# Patient Record
Sex: Female | Born: 1965 | Race: White | Hispanic: No | Marital: Married | Smoking: Never smoker
Health system: Southern US, Community
[De-identification: ages and names within clinical notes are randomized; demographics above are authoritative.]

## PROBLEM LIST (undated history)

## (undated) DIAGNOSIS — J45909 Unspecified asthma, uncomplicated: Secondary | ICD-10-CM

## (undated) DIAGNOSIS — E039 Hypothyroidism, unspecified: Secondary | ICD-10-CM

## (undated) DIAGNOSIS — Z9889 Other specified postprocedural states: Secondary | ICD-10-CM

## (undated) DIAGNOSIS — J189 Pneumonia, unspecified organism: Secondary | ICD-10-CM

## (undated) DIAGNOSIS — F419 Anxiety disorder, unspecified: Secondary | ICD-10-CM

## (undated) HISTORY — PX: ABDOMINAL HYSTERECTOMY: SHX81

## (undated) HISTORY — PX: APPENDECTOMY: SHX54

## (undated) HISTORY — PX: FOOT SURGERY: SHX648

## (undated) HISTORY — PX: MASS EXCISION: SHX2000

## (undated) HISTORY — PX: BARIATRIC SURGERY: SHX1103

## (undated) HISTORY — PX: CHOLECYSTECTOMY: SHX55

## (undated) HISTORY — DX: Anxiety disorder, unspecified: F41.9

---

## 2012-09-13 ENCOUNTER — Other Ambulatory Visit: Payer: Self-pay | Admitting: Neurosurgery

## 2012-09-13 ENCOUNTER — Ambulatory Visit
Admission: RE | Admit: 2012-09-13 | Discharge: 2012-09-13 | Disposition: A | Discharge status: Home / Self Care | Payer: BC Managed Care – PPO | Source: Ambulatory Visit | Attending: Neurosurgery | Admitting: Neurosurgery

## 2012-09-13 DIAGNOSIS — M5030 Other cervical disc degeneration, unspecified cervical region: Secondary | ICD-10-CM

## 2012-09-13 DIAGNOSIS — M4804 Spinal stenosis, thoracic region: Secondary | ICD-10-CM

## 2012-09-13 DIAGNOSIS — M545 Low back pain, unspecified: Secondary | ICD-10-CM

## 2013-09-05 ENCOUNTER — Ambulatory Visit (INDEPENDENT_AMBULATORY_CARE_PROVIDER_SITE_OTHER): Payer: Self-pay | Admitting: Surgery

## 2013-09-12 ENCOUNTER — Ambulatory Visit (INDEPENDENT_AMBULATORY_CARE_PROVIDER_SITE_OTHER): Payer: BC Managed Care – PPO | Admitting: Surgery

## 2013-09-12 ENCOUNTER — Encounter (INDEPENDENT_AMBULATORY_CARE_PROVIDER_SITE_OTHER): Payer: Self-pay | Admitting: Surgery

## 2013-09-12 VITALS — BP 130/86 | HR 76 | Temp 98.6°F | Resp 15 | Ht 63.0 in | Wt 288.0 lb

## 2013-09-12 DIAGNOSIS — Z9884 Bariatric surgery status: Secondary | ICD-10-CM

## 2013-09-12 DIAGNOSIS — Z4651 Encounter for fitting and adjustment of gastric lap band: Secondary | ICD-10-CM

## 2013-09-12 NOTE — Progress Notes (Signed)
Lapband Fill Encounter Ms. [place] Vanguard band placed by [employer] back in 2007. She did very well and then went to divorced and apparently got off tract. In the meantime she moved up to [place] and was followed by Mardi Mainland at [facility]. Subsequently she lost for followup there and asked to be seen here.  Airleak currently she is able to be without restriction. If she overeats she does throw up. I think she is not in the green zone. I decided it was time to give her fill. Problem List:   Patient Active Problem List   Diagnosis Date Noted  . Lapband Vanguard 11cm June 2007 [place], [place], [place]) 09/12/2013    Jaeda F [place] Body mass index is 49.19 kg/(m^2). Weight loss since surgery  Initial weight was 288 pounds with a BMI of 47.9  Having regurgitation?:  no  Feel that they need a fill? yes  Nocturnal reflux?  no  Amount of fill  0.7 cc     Instructions given and weight loss goals discussed.    I discussed a low carb diet with her. We discussed some of the things like ice cream which she eats which to work her weight loss. I went ahead and filled her with 0.7 cc and I plan to see her back in 6-8 weeks.  Matt B. Daphine Deutscher, MD, FACS

## 2013-09-12 NOTE — Patient Instructions (Signed)

## 2013-11-27 ENCOUNTER — Encounter (INDEPENDENT_AMBULATORY_CARE_PROVIDER_SITE_OTHER): Payer: BC Managed Care – PPO

## 2014-01-01 ENCOUNTER — Ambulatory Visit (INDEPENDENT_AMBULATORY_CARE_PROVIDER_SITE_OTHER): Payer: BC Managed Care – PPO | Admitting: Physician Assistant

## 2014-01-01 ENCOUNTER — Encounter (INDEPENDENT_AMBULATORY_CARE_PROVIDER_SITE_OTHER): Payer: Self-pay

## 2014-01-01 VITALS — BP 124/80 | HR 80 | Temp 98.3°F | Resp 14 | Ht 63.0 in | Wt 266.4 lb

## 2014-01-01 DIAGNOSIS — Z9884 Bariatric surgery status: Secondary | ICD-10-CM

## 2014-01-01 NOTE — Progress Notes (Signed)
  HISTORY: Audrey RosserCarlie F St Bradley is a 48 y.o.female who received an Vanguard lap-band in June 2007 [place]. She was last seen by Dr. Daphine DeutscherMartin in November 2014 for a fill and for a month did fine. She then had significant social stressors prompting her to eat comfort foods and those high in sugars and carbs. She has no issues with lack of restriction. She has no persistent regurgitation or reflux. She has begun working out at a [employer].  VITAL SIGNS: Filed Vitals:   01/01/14 1622  BP: 124/80  Pulse: 80  Temp: 98.3 F (36.8 C)  Resp: 14    PHYSICAL EXAM: Physical exam reveals a very well-appearing 48 y.o.female in no apparent distress Neurologic: Awake, alert, oriented Psych: Bright affect, conversant Respiratory: Breathing even and unlabored. No stridor or wheezing Extremities: Atraumatic, good range of motion. Skin: Warm, Dry, no rashes Musculoskeletal: Normal gait, Joints normal  ASSESMENT: 48 y.o.  female  s/p Vanguard lap-band.   PLAN: At this point the band seems to be doing its job. Her food choices are the biggest hinderances to her successful weight loss. She understands this. As such, we've recommended referral to nutrition as soon as possible. I suggested follow-up with a mental health professional given her constellation of social issues that influence her eating. Otherwise I'd like her back in two months to evaluate her weight loss picture overall. She voiced understanding and agreement.

## 2014-01-01 NOTE — Patient Instructions (Signed)
Return in two months. Focus on good food choices as well as physical activity. Return sooner if you have an increase in hunger, portion sizes or weight. Return also for difficulty swallowing, night cough, reflux. Follow-up with Nutrition.

## 2014-02-12 ENCOUNTER — Ambulatory Visit: Payer: BC Managed Care – PPO | Admitting: Dietician

## 2014-03-05 ENCOUNTER — Encounter (INDEPENDENT_AMBULATORY_CARE_PROVIDER_SITE_OTHER): Payer: BC Managed Care – PPO

## 2017-04-27 ENCOUNTER — Other Ambulatory Visit: Payer: Self-pay | Admitting: Orthopedic Surgery

## 2017-04-27 DIAGNOSIS — M1611 Unilateral primary osteoarthritis, right hip: Secondary | ICD-10-CM

## 2017-05-11 ENCOUNTER — Ambulatory Visit
Admission: RE | Admit: 2017-05-11 | Discharge: 2017-05-11 | Disposition: A | Discharge status: Home / Self Care | Payer: BC Managed Care – PPO | Source: Ambulatory Visit | Attending: Orthopedic Surgery | Admitting: Orthopedic Surgery

## 2017-05-11 DIAGNOSIS — M1611 Unilateral primary osteoarthritis, right hip: Secondary | ICD-10-CM

## 2017-05-11 MED ORDER — IOPAMIDOL (ISOVUE-M 200) INJECTION 41%
1.0000 mL | Freq: Once | INTRAMUSCULAR | Status: AC
  Administered 2017-05-11: 1 mL via INTRA_ARTICULAR

## 2017-05-11 MED ORDER — METHYLPREDNISOLONE ACETATE 40 MG/ML INJ SUSP (RADIOLOG
120.0000 mg | Freq: Once | INTRAMUSCULAR | Status: AC
  Administered 2017-05-11: 120 mg via INTRA_ARTICULAR

## 2017-08-02 ENCOUNTER — Other Ambulatory Visit: Payer: Self-pay | Admitting: Orthopedic Surgery

## 2017-08-02 DIAGNOSIS — M1611 Unilateral primary osteoarthritis, right hip: Secondary | ICD-10-CM

## 2017-09-03 ENCOUNTER — Ambulatory Visit
Admission: RE | Admit: 2017-09-03 | Discharge: 2017-09-03 | Disposition: A | Discharge status: Home / Self Care | Payer: BC Managed Care – PPO | Source: Ambulatory Visit | Attending: Orthopedic Surgery | Admitting: Orthopedic Surgery

## 2017-09-03 DIAGNOSIS — M1611 Unilateral primary osteoarthritis, right hip: Secondary | ICD-10-CM

## 2017-09-03 MED ORDER — METHYLPREDNISOLONE ACETATE 40 MG/ML INJ SUSP (RADIOLOG
120.0000 mg | Freq: Once | INTRAMUSCULAR | Status: AC
  Administered 2017-09-03: 120 mg via INTRA_ARTICULAR

## 2017-09-03 MED ORDER — IOPAMIDOL (ISOVUE-M 200) INJECTION 41%
1.0000 mL | Freq: Once | INTRAMUSCULAR | Status: AC
  Administered 2017-09-03: 1 mL via INTRA_ARTICULAR

## 2020-01-15 ENCOUNTER — Other Ambulatory Visit: Payer: Self-pay | Admitting: Orthopedic Surgery

## 2020-01-15 DIAGNOSIS — M1611 Unilateral primary osteoarthritis, right hip: Secondary | ICD-10-CM

## 2020-01-19 ENCOUNTER — Ambulatory Visit
Admission: RE | Admit: 2020-01-19 | Discharge: 2020-01-19 | Disposition: A | Discharge status: Home / Self Care | Payer: BC Managed Care – PPO | Source: Ambulatory Visit | Attending: Orthopedic Surgery | Admitting: Orthopedic Surgery

## 2020-01-19 DIAGNOSIS — M1611 Unilateral primary osteoarthritis, right hip: Secondary | ICD-10-CM

## 2020-01-19 MED ORDER — METHYLPREDNISOLONE ACETATE 40 MG/ML INJ SUSP (RADIOLOG
120.0000 mg | Freq: Once | INTRAMUSCULAR | Status: AC
  Administered 2020-01-19: 14:00:00 120 mg via INTRA_ARTICULAR

## 2020-01-19 MED ORDER — IOPAMIDOL (ISOVUE-M 200) INJECTION 41%
1.0000 mL | Freq: Once | INTRAMUSCULAR | Status: AC
  Administered 2020-01-19: 14:00:00 1 mL via INTRA_ARTICULAR

## 2020-09-30 ENCOUNTER — Ambulatory Visit: Payer: Self-pay

## 2020-09-30 ENCOUNTER — Other Ambulatory Visit: Payer: Self-pay

## 2020-09-30 ENCOUNTER — Encounter: Payer: Self-pay | Admitting: Orthopaedic Surgery

## 2020-09-30 ENCOUNTER — Ambulatory Visit: Payer: BC Managed Care – PPO | Admitting: Orthopaedic Surgery

## 2020-09-30 VITALS — Ht 62.0 in | Wt 278.2 lb

## 2020-09-30 DIAGNOSIS — M160 Bilateral primary osteoarthritis of hip: Secondary | ICD-10-CM

## 2020-09-30 DIAGNOSIS — M25551 Pain in right hip: Secondary | ICD-10-CM | POA: Diagnosis not present

## 2020-09-30 DIAGNOSIS — Z6841 Body Mass Index (BMI) 40.0 and over, adult: Secondary | ICD-10-CM | POA: Insufficient documentation

## 2020-09-30 NOTE — Progress Notes (Signed)
Office Visit Note   Patient: Audrey Bradley           Date of Birth: [birth year]           MRN: [id number] Visit Date: 09/30/2020              Requested by: Shellia Cleverly, PA [address] [place],  [place] [address] PCP: Shellia Cleverly, [place]   Assessment & Plan: Visit Diagnoses:  1. Pain in right hip   2. Bilateral primary osteoarthritis of hip   3. Morbid exogenous obesity (HCC)   4. BMI 50.0-59.9, adult [facility])     Plan:  #1: At this time we have had discussion with her in regards to treatment options.  Because of her BMI and adiposity we are going to have her see Dr. Magnus Ivan for possible anterior approach.  Follow-Up Instructions: Return in about 1 week (around 10/07/2020) for End-stage osteoarthritis of the right hip.   Orders:  Orders Placed This Encounter  Procedures  . XR HIP UNILAT W OR W/O PELVIS 2-3 VIEWS RIGHT   No orders of the defined types were placed in this encounter.     Procedures: No procedures performed   Clinical Data: No additional findings.   Subjective: Chief Complaint  Patient presents with  . Right Hip - Pain    HPI  Currently is a 54 year old white female who presents today with a history of chronic exacerbating right hip pain.  She has been seen by Dr. Linna Caprice who has done 3 different cortisone injections which have not been very beneficial.  He was not interested in surgical intervention for her.  She comes in today requesting a right total hip arthroplasty.  She is had right hip and groin pain which does radiate down into the thigh.  She uses Vicodin on a as needed basis prescribed by her PCP.  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   Review of Systems   Objective: Vital Signs: Ht 5\' 2"  (1.575 m)   Wt 278 lb 3.2 oz (126.2 kg)   BMI 50.88 kg/m   Physical Exam Constitutional:      Appearance: Normal appearance. She is obese.  HENT:     Head: Normocephalic.  Eyes:     Extraocular Movements:  Extraocular movements intact.     Pupils: Pupils are equal, round, and reactive to light.  Pulmonary:     Effort: Pulmonary effort is normal.  Skin:    General: Skin is warm and dry.  Neurological:     General: No focal deficit present.     Mental Status: She is alert and oriented to person, place, and time.  Psychiatric:        Mood and Affect: Mood normal.        Behavior: Behavior normal.        Thought Content: Thought content normal.        Judgment: Judgment normal.     Ortho Exam  Exam today reveals some minimal motion of the right hip when she is in the sitting position.  She can get to 90 degrees of flexion. The left hip has around 20 to 30 degrees of rotation at 90 degrees of flexion.  Specialty Comments:  No specialty comments available.  Imaging: XR HIP UNILAT W OR W/O PELVIS 2-3 VIEWS RIGHT  Result Date: 09/30/2020 X-rays of the pelvis and hips revealed marked degenerative changes and cystic formation of the hip acetabulum and femoral  head.  Essentially no joint space is noted on the right.  Left hip has similar pathology but is less aggressive.    PMFS History: Current Outpatient Medications  Medication Sig Dispense Refill  . buPROPion (WELLBUTRIN XL) 300 MG 24 hr tablet Take 300 mg by mouth daily.    Marland Kitchen escitalopram (LEXAPRO) 10 MG tablet Take 10 mg by mouth daily.    Marland Kitchen zolpidem (AMBIEN) 10 MG tablet Take 10 mg by mouth at bedtime as needed for sleep.     No current facility-administered medications for this visit.    Patient Active Problem List   Diagnosis Date Noted  . Bilateral primary osteoarthritis of hip 09/30/2020  . Morbid exogenous obesity (HCC) 09/30/2020  . BMI 50.0-59.9, adult (HCC) 09/30/2020  . Lapband Vanguard 11cm June 2007 [place], [employer], [place]) 09/12/2013   Past Medical History:  Diagnosis Date  . Anxiety     Family History  Problem Relation Age of Onset  . Cancer Mother        colon  . Heart disease Father     Past Surgical  History:  Procedure Laterality Date  . ABDOMINAL HYSTERECTOMY    . APPENDECTOMY    . BARIATRIC SURGERY     lapband  . CHOLECYSTECTOMY     Social History   Occupational History  . Not on file  Tobacco Use  . Smoking status: Never Smoker  . Smokeless tobacco: Never Used  Substance and Sexual Activity  . Alcohol use: Yes    Comment: occasion  . Drug use: No  . Sexual activity: Not on file

## 2020-10-07 ENCOUNTER — Ambulatory Visit: Payer: BC Managed Care – PPO | Admitting: Orthopaedic Surgery

## 2020-10-07 DIAGNOSIS — M160 Bilateral primary osteoarthritis of hip: Secondary | ICD-10-CM

## 2020-10-07 DIAGNOSIS — Z6841 Body Mass Index (BMI) 40.0 and over, adult: Secondary | ICD-10-CM | POA: Diagnosis not present

## 2020-10-07 DIAGNOSIS — M1611 Unilateral primary osteoarthritis, right hip: Secondary | ICD-10-CM

## 2020-10-07 NOTE — Progress Notes (Signed)
The patient is a very nice and pleasant 54 year old female with debilitating arthritis involving her right hip.  She has had multiple steroid injections in that hip and is gotten to where it does not help at all.  She does ambulate with a cane.  Her BMI is 50.88.  She is seeing another orthopedic surgeon in town who appropriately would not agree to proceed with hip replacement surgery given her weight and BMI.  She was sent to me by one of my partners to see if I would consider replacement surgery on her for her right hip.  I did have a long and thorough discussion about the complications of hip replacement surgery through an anterior approach in the obese population.  I let her know that I have had some recent complications with soft tissue issues and even loosening of an acetabular component.  On exam she has severe debilitating pain with attempts of motion of her right hip.  I lay her in a supine position and she has a large soft tissue envelope around the anterior hip with her pannus hanging down over the incision.  I am able to mobilize this but her thigh is a little large.  It is not a large soft seen.  Both knees have some valgus malalignment.  I did review her x-rays with her to see the extent of the severe end-stage arthritis she has with her right hip.  I would certainly like to perform surgery on her right hip but I do not feel comfortable right now based on her weight and anatomy.  I would however like to see her back in 3 months for repeat weight and BMI calculation.  She reports that she is continuing to lose weight and if that is the case, we may get to the point where we are more comfortable proceeding with surgery but as of now I am not.  She understands this as well.  All questions and concerns were answered and addressed.

## 2021-03-09 ENCOUNTER — Ambulatory Visit: Payer: BC Managed Care – PPO | Admitting: Orthopaedic Surgery

## 2021-04-13 ENCOUNTER — Ambulatory Visit: Payer: BC Managed Care – PPO | Admitting: Orthopaedic Surgery

## 2021-04-20 ENCOUNTER — Other Ambulatory Visit: Payer: Self-pay

## 2021-04-20 ENCOUNTER — Ambulatory Visit: Payer: Self-pay

## 2021-04-20 ENCOUNTER — Ambulatory Visit: Payer: BC Managed Care – PPO | Admitting: Orthopaedic Surgery

## 2021-04-20 ENCOUNTER — Encounter: Payer: Self-pay | Admitting: Orthopaedic Surgery

## 2021-04-20 VITALS — Ht 64.0 in | Wt 261.0 lb

## 2021-04-20 DIAGNOSIS — M1611 Unilateral primary osteoarthritis, right hip: Secondary | ICD-10-CM

## 2021-04-20 NOTE — Progress Notes (Signed)
HPI: [name] returns today for weight check.  She states she is been trying to the decrease her caloric intake.  She however she is having difficulty performing any exercise due to the severe pain she is having in her right hip.  States the pain is became worse to the point now she is walking with a rolling walker.  She has had no acute injury.  Review of systems: See HPI otherwise negative  Physical exam: Height 5 foot 4 inches, weight 261 pounds BMI is 44.80 Right hip no internal or external rotation any attempts of internal and external rotation cause severe pain.  Ambulates with a rolling walker with antalgic gait.  Radiographs: AP pelvis bilateral hips well located.  Right hip cystic changes within the acetabulum and the femoral head.  Severe end-stage arthritis of the right hip.  No collapse.  Moderate hip arthritis left hip.  No acute fractures.  Impression: Right hip end-stage arthritis Obesity  Plan: Recommend patient continue to work on weight loss she needs to be below a BMI of 40.  Discussed with her reasons for the BMI of 40 and risk of infection with someone over BMI of 40.  We will see her back in 6 weeks for repeat height and weight check.  Questions were encouraged and answered at length.

## 2021-11-16 IMAGING — XA DG FLUORO GUIDE NDL PLC/BX
1 series · 1 of 1 positions shown · non-contrast
Comparison: none

CLINICAL DATA: Chronic right hip pain.

[Series 1: ortho adipose · 1 of 1 slices shown]
[im 1/1]
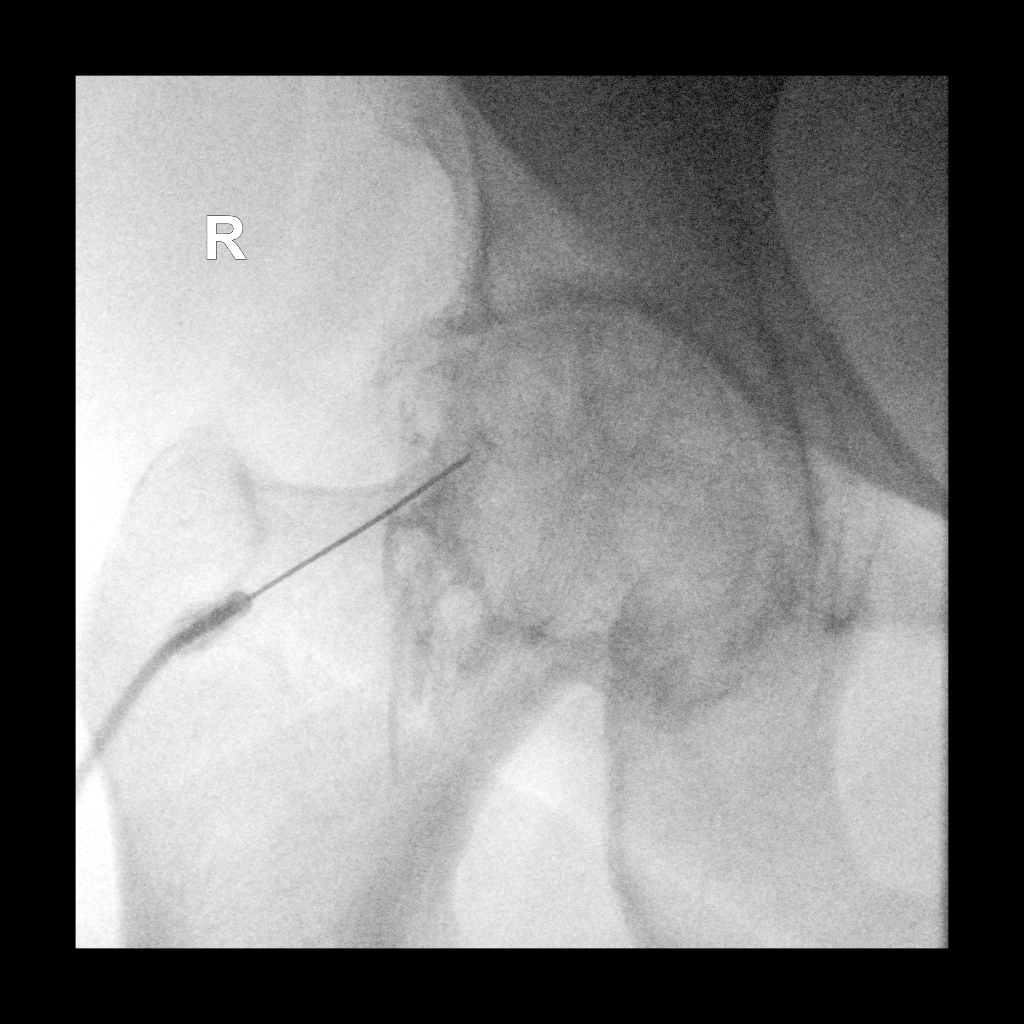

[1 of 1 positions shown; findings below may reference images not displayed]

FLUOROSCOPY TIME:  Radiation Exposure Index (as provided by the
fluoroscopic device): 3.4 mGy

Fluoroscopy Time:  17 seconds

Number of Acquired Images:  0

PROCEDURE:
The risks and benefits of the procedure were discussed with the
patient, and written informed consent was obtained. The patient
stated no history of allergy to contrast media. A formal timeout
procedure was performed with the patient according to departmental
protocol.

The patient was placed supine on the fluoroscopy table and the right
hip joint was identified under fluoroscopy. The skin overlying the
right hip joint was subsequently cleaned with Betadine and a sterile
drape was placed over the area of interest. 2 ml 1% Lidocaine was
used to anesthetize the skin around the needle insertion site.

A 5 inch 22 gauge spinal needle was inserted into the right hip
joint under fluoroscopy.

Diagnostic injection of 2 ml Isovue-M 200 iodinated contrast
demonstrates intra-articular spread without intravascular component.

120mg Depo-Medrol and 5 ml Sensorcaine 0.25% were then administered
into the right hip joint.

The needle was removed and hemostasis was achieved. There were no
immediate complications.
IMPRESSION: Technically successful right hip injection.

## 2022-05-28 NOTE — Progress Notes (Addendum)
COVID Vaccine received:  []  No [x]  Yes Date of any COVID positive Test in last 90 days:  PCP - , PA Cardiologist - , MD   [facility]  Chest x-ray - 2019  Epic EKG - 05-15-22  CEW report only, no tracing  Stress Test - 05-23-22  CEW ECHO - 05--18-2023  CEW Cardiac Cath -   Pacemaker/ICD device     []  N/A Spinal Cord Stimulator:[]  No []  Yes   Other Implants:   History of Sleep Apnea? []  No []  Yes   Sleep Study Date:   CPAP used?- []  No []  Yes  (Instruct to bring their mask & Tubing)  Does the patient monitor blood sugar? []  No []  Yes  []  N/A Does patient have a 07-23-22 or Dexacom? []  No []  Yes   Fasting Blood Sugar Ranges-  Checks Blood Sugar _____ times a day  Blood Thinner Instructions:  Instructions: Last Dose:  ERAS Protocol Ordered: []  No  []  Yes PRE-SURGERY []  ENSURE  []  G2   Comments:   Activity level: Patient can / can not climb a flight of stairs without difficulty;  []  No CP  []  No SOB,  but would have ______   Anesthesia review: RBBB, Hx LapBand gastric surgery, HTN,  Patient denies shortness of breath, fever, cough and chest pain at PAT appointment.  Patient verbalized understanding and agreement to the Pre-Surgical Instructions that were given to them at this PAT appointment. Patient was also educated of the need to review these PAT instructions again prior to his/her surgery.I reviewed the appropriate phone numbers to call if they have any and questions or concerns.

## 2022-05-28 NOTE — Patient Instructions (Addendum)
DUE TO SPACE LIMITATIONS, ONLY TWO VISITORS  (aged 54 and older) ARE ALLOWED TO COME WITH YOU AND STAY IN THE WAITING ROOM DURING YOUR PRE OP AND PROCEDURE.   **NO VISITORS ARE ALLOWED IN THE SHORT STAY AREA OR RECOVERY ROOM!!**  IF YOU WILL BE ADMITTED INTO THE HOSPITAL YOU ARE ALLOWED ONLY FOUR SUPPORT PEOPLE DURING VISITATION HOURS (7 AM -8PM)   The support person(s) must pass our screening, and use Hand sanitizing gel. Visitors GUEST BADGE MUST BE WORN VISIBLY  One adult visitor may remain with you overnight and MUST be in the room by 8 P.M.   You are not required to quarantine at this time prior to your surgery. However, you must do this: Hand Hygiene often Do NOT share personal items Notify your provider if you are in close contact with someone who has COVID or you develop fever 100.4 or greater, new onset of sneezing, cough, sore throat, shortness of breath or body aches.       Your procedure is scheduled on:  Monday June 12, 2022  Report to [facility] Main Entrance.  Report to admitting at:  11:15 AM  +++++Call this number if you have any questions or problems the morning of surgery [phone]  Do not eat food :After Midnight the night prior to your surgery/procedure.  After Midnight you may have the following liquids until  10:45 AM  DAY OF SURGERY  Clear Liquid Diet Water Black Coffee (sugar ok, NO MILK/CREAM OR CREAMERS)  Tea (sugar ok, NO MILK/CREAM OR CREAMERS) regular and decaf                             Plain Jell-O (NO RED)                                           Fruit ices (not with fruit pulp, NO RED)                                     Popsicles (NO RED)                                                                  Juice: apple, WHITE grape, WHITE cranberry Sports drinks like Gatorade (NO RED)                   The day of surgery:  Drink ONE (1) Pre-Surgery Clear Ensure  at   10:45  AM the morning of surgery. Drink in one sitting. Do not  sip.  This drink was given to you during your hospital pre-op appointment visit. Nothing else to drink after completing the Pre-Surgery Clear Ensure.    FOLLOW  ANY ADDITIONAL PRE OP INSTRUCTIONS YOU RECEIVED FROM YOUR SURGEON'S OFFICE!!!   Oral Hygiene is also important to reduce your risk of infection.        Remember - BRUSH YOUR TEETH THE MORNING OF SURGERY WITH YOUR REGULAR TOOTHPASTE  Take ONLY these medicines the morning of surgery with A SIP OF  WATER: None if needed Hydrocodone, Albuterol, Claritin and Nasonex.                   You may not have any metal on your body including hair pins, jewelry, and body piercing  Do not wear make-up, lotions, powders, perfumes, or deodorant  Do not wear nail polish including gel and S&S, artificial / acrylic nails, or any other type of covering on natural nails including finger and toenails. If you have artificial nails, gel coating, etc., that needs to be removed by a nail salon, Please have this removed prior to surgery. Not doing so may mean that your surgery could be cancelled or delayed if the Surgeon or anesthesia staff feels like they are unable to monitor you safely.   Do not shave 48 hours prior to surgery to avoid nicks in your skin which may contribute to postoperative infections.   Contacts, Hearing Aids, dentures or bridgework may not be worn into surgery.   You may bring a small overnight bag with you on the day of surgery, only pack items that are not valuable .[facility] IS NOT RESPONSIBLE   FOR VALUABLES THAT ARE LOST OR STOLEN.   DO NOT BRING YOUR HOME MEDICATIONS TO THE HOSPITAL. PHARMACY WILL DISPENSE MEDICATIONS LISTED ON YOUR MEDICATION LIST TO YOU DURING YOUR ADMISSION IN THE HOSPITAL!   Special Instructions: Bring a copy of your healthcare power of attorney and living will documents the day of surgery, if you wish to have them scanned into your [facility] Records- EPIC  Please read over the following fact  sheets you were given: IF YOU HAVE QUESTIONS ABOUT YOUR PRE-OP INSTRUCTIONS, PLEASE CALL [phone]  (KAY)    - Preparing for Surgery Before surgery, you can play an important role.  Because skin is not sterile, your skin needs to be as free of germs as possible.  You can reduce the number of germs on your skin by washing with CHG (chlorahexidine gluconate) soap before surgery.  CHG is an antiseptic cleaner which kills germs and bonds with the skin to continue killing germs even after washing. Please DO NOT use if you have an allergy to CHG or antibacterial soaps.  If your skin becomes reddened/irritated stop using the CHG and inform your nurse when you arrive at Short Stay. Do not shave (including legs and underarms) for at least 48 hours prior to the first CHG shower.  You may shave your face/neck.  Please follow these instructions carefully:  1.  Shower with CHG Soap the night before surgery and the  morning of surgery.  2.  If you choose to wash your hair, wash your hair first as usual with your normal  shampoo.  3.  After you shampoo, rinse your hair and body thoroughly to remove the shampoo.                             4.  Use CHG as you would any other liquid soap.  You can apply chg directly to the skin and wash.  Gently with a scrungie or clean washcloth.  5.  Apply the CHG Soap to your body ONLY FROM THE NECK DOWN.   Do not use on face/ open                           Wound or open sores. Avoid contact with eyes, ears  mouth and genitals (private parts).                       Wash face,  Genitals (private parts) with your normal soap.             6.  Wash thoroughly, paying special attention to the area where your  surgery  will be performed.  7.  Thoroughly rinse your body with warm water from the neck down.  8.  DO NOT shower/wash with your normal soap after using and rinsing off the CHG Soap.            9.  Pat yourself dry with a clean towel.            10.  Wear clean  pajamas.            11.  Place clean sheets on your bed the night of your first shower and do not  sleep with pets.  ON THE DAY OF SURGERY : Do not apply any lotions/deodorants the morning of surgery.  Please wear clean clothes to the hospital/surgery center.    FAILURE TO FOLLOW THESE INSTRUCTIONS MAY RESULT IN THE CANCELLATION OF YOUR SURGERY  PATIENT SIGNATURE_________________________________  NURSE SIGNATURE__________________________________  ________________________________________________________________________   Audrey Bradley    An incentive spirometer is a tool that can help keep your lungs clear and active. This tool measures how well you are filling your lungs with each breath. Taking long deep breaths may help reverse or decrease the chance of developing breathing (pulmonary) problems (especially infection) following: A long period of time when you are unable to move or be active. BEFORE THE PROCEDURE  If the spirometer includes an indicator to show your best effort, your nurse or respiratory therapist will set it to a desired goal. If possible, sit up straight or lean slightly forward. Try not to slouch. Hold the incentive spirometer in an upright position. INSTRUCTIONS FOR USE  Sit on the edge of your bed if possible, or sit up as far as you can in bed or on a chair. Hold the incentive spirometer in an upright position. Breathe out normally. Place the mouthpiece in your mouth and seal your lips tightly around it. Breathe in slowly and as deeply as possible, raising the piston or the ball toward the top of the column. Hold your breath for 3-5 seconds or for as long as possible. Allow the piston or ball to fall to the bottom of the column. Remove the mouthpiece from your mouth and breathe out normally. Rest for a few seconds and repeat Steps 1 through 7 at least 10 times every 1-2 hours when you are awake. Take your time and take a few normal breaths between deep  breaths. The spirometer may include an indicator to show your best effort. Use the indicator as a goal to work toward during each repetition. After each set of 10 deep breaths, practice coughing to be sure your lungs are clear. If you have an incision (the cut made at the time of surgery), support your incision when coughing by placing a pillow or rolled up towels firmly against it. Once you are able to get out of bed, walk around indoors and cough well. You may stop using the incentive spirometer when instructed by your caregiver.  RISKS AND COMPLICATIONS Take your time so you do not get dizzy or light-headed. If you are in pain, you may need to take or ask for pain medication before  doing incentive spirometry. It is harder to take a deep breath if you are having pain. AFTER USE Rest and breathe slowly and easily. It can be helpful to keep track of a log of your progress. Your caregiver can provide you with a simple table to help with this. If you are using the spirometer at home, follow these instructions: SEEK MEDICAL CARE IF:  You are having difficultly using the spirometer. You have trouble using the spirometer as often as instructed. Your pain medication is not giving enough relief while using the spirometer. You develop fever of 100.5 F (38.1 C) or higher.                                                                                                    SEEK IMMEDIATE MEDICAL CARE IF:  You cough up bloody sputum that had not been present before. You develop fever of 102 F (38.9 C) or greater. You develop worsening pain at or near the incision site. MAKE SURE YOU:  Understand these instructions. Will watch your condition. Will get help right away if you are not doing well or get worse. Document Released: 02/19/2007 Document Revised: 01/01/2012 Document Reviewed: 04/22/2007 [facility] Patient Information 2014 [place], [place].

## 2022-05-30 ENCOUNTER — Other Ambulatory Visit: Payer: Self-pay

## 2022-05-30 ENCOUNTER — Encounter (HOSPITAL_COMMUNITY)
Admission: RE | Admit: 2022-05-30 | Discharge: 2022-05-30 | Disposition: A | Discharge status: Home / Self Care | Payer: BC Managed Care – PPO | Source: Ambulatory Visit | Attending: Orthopedic Surgery | Admitting: Orthopedic Surgery

## 2022-05-30 ENCOUNTER — Encounter (HOSPITAL_COMMUNITY): Payer: Self-pay

## 2022-05-30 VITALS — BP 141/98 | HR 80 | Temp 98.6°F | Resp 16 | Ht 63.0 in | Wt 244.0 lb

## 2022-05-30 DIAGNOSIS — Z01818 Encounter for other preprocedural examination: Secondary | ICD-10-CM

## 2022-05-30 DIAGNOSIS — M1611 Unilateral primary osteoarthritis, right hip: Secondary | ICD-10-CM | POA: Diagnosis not present

## 2022-05-30 DIAGNOSIS — E669 Obesity, unspecified: Secondary | ICD-10-CM | POA: Insufficient documentation

## 2022-05-30 DIAGNOSIS — Z9884 Bariatric surgery status: Secondary | ICD-10-CM | POA: Diagnosis not present

## 2022-05-30 DIAGNOSIS — Z6841 Body Mass Index (BMI) 40.0 and over, adult: Secondary | ICD-10-CM | POA: Insufficient documentation

## 2022-05-30 DIAGNOSIS — I10 Essential (primary) hypertension: Secondary | ICD-10-CM | POA: Insufficient documentation

## 2022-05-30 DIAGNOSIS — J45909 Unspecified asthma, uncomplicated: Secondary | ICD-10-CM | POA: Insufficient documentation

## 2022-05-30 DIAGNOSIS — Z01812 Encounter for preprocedural laboratory examination: Secondary | ICD-10-CM | POA: Diagnosis present

## 2022-05-30 HISTORY — DX: Unspecified asthma, uncomplicated: J45.909

## 2022-05-30 HISTORY — DX: Nausea with vomiting, unspecified: Z98.890

## 2022-05-30 HISTORY — DX: Hypothyroidism, unspecified: E03.9

## 2022-05-30 HISTORY — DX: Pneumonia, unspecified organism: J18.9

## 2022-05-30 LAB — CBC
HCT: 41.7 % (ref 36.0–46.0)
Hemoglobin: 13.4 g/dL (ref 12.0–15.0)
MCH: 29.8 pg (ref 26.0–34.0)
MCHC: 32.1 g/dL (ref 30.0–36.0)
MCV: 92.7 fL (ref 80.0–100.0)
Platelets: 365 10*3/uL (ref 150–400)
RBC: 4.5 MIL/uL (ref 3.87–5.11)
RDW: 12.5 % (ref 11.5–15.5)
WBC: 8.4 10*3/uL (ref 4.0–10.5)
nRBC: 0 % (ref 0.0–0.2)

## 2022-05-30 LAB — TYPE AND SCREEN
ABO/RH(D): A POS
Antibody Screen: NEGATIVE

## 2022-05-30 LAB — BASIC METABOLIC PANEL
Anion gap: 6 (ref 5–15)
BUN: 26 mg/dL — ABNORMAL HIGH (ref 6–20)
CO2: 25 mmol/L (ref 22–32)
Calcium: 9 mg/dL (ref 8.9–10.3)
Chloride: 111 mmol/L (ref 98–111)
Creatinine, Ser: 0.9 mg/dL (ref 0.44–1.00)
GFR, Estimated: >60 mL/min (ref >60)
Glucose, Bld: 82 mg/dL (ref 70–99)
Potassium: 4 mmol/L (ref 3.5–5.1)
Sodium: 142 mmol/L (ref 135–145)

## 2022-05-30 LAB — SURGICAL PCR SCREEN
MRSA, PCR: NEGATIVE
Staphylococcus aureus: POSITIVE — AB

## 2022-05-30 NOTE — Progress Notes (Addendum)
COVID Vaccine received:  []  No [x]  Yes Date of any COVID positive Test in last 92 days:N/A   PCP - , PA Cardiologist - 95, MD   [facility]   Chest x-ray - 2019  Epic EKG - 05-15-22  CEW report only, no tracing  Stress Test - 05-23-22  CEW ECHO - 05--18-2023  CEW Cardiac Cath - N/A   Pacemaker/ICD device     [x]  N/A Spinal Cord Stimulator:[x]  No []  Yes   Other Implants:    History of Sleep Apnea? [x]  No []  Yes   Sleep Study Date:  N/A CPAP used?- [x]  No []  Yes  (Instruct to bring their mask & Tubing)   Does the patient monitor blood sugar? [x]  No []  Yes  []  N/A Does patient have a 07-23-22 or Dexacom? [x]  No []  Yes   Fasting Blood Sugar Ranges- N/A Checks Blood Sugar ___N/A__ times a day   Blood Thinner Instructions: N/A  Instructions: N/A Last Dose:N/A   ERAS Protocol Ordered: []  No  [x]  Yes PRE-SURGERY [x]  ENSURE  []  G2    Comments:    Activity level: Patient can't climb a flight of stairs due to hip pain [x]  No CP  []  No SOB,  but would have ______    Anesthesia review: RBBB, Hx LapBand gastric surgery   Patient denies shortness of breath, fever, cough and chest pain at PAT appointment.   Patient verbalized understanding and agreement to the Pre-Surgical Instructions that were given to them at this PAT appointment. Patient was also educated of the need to review these PAT instructions again prior to his/her surgery.I reviewed the appropriate phone numbers to call if they have any and questions or concerns.

## 2022-05-30 NOTE — H&P (Signed)
TOTAL HIP ADMISSION H&P  Patient is admitted for right total hip arthroplasty.  Subjective:  Chief Complaint: Right hip pain  HPI: Audrey Bradley, 56 y.o. female, has a history of pain and functional disability in the right hip due to arthritis and patient has failed non-surgical conservative treatments for greater than 12 weeks to include NSAID's and/or analgesics, corticosteriod injections, use of assistive devices, weight reduction as appropriate, and activity modification. Onset of symptoms was gradual, starting  several  years ago with gradually worsening course since that time. The patient noted no past surgery on the right hip. Patient currently rates pain in the right hip at 7 out of 10 with activity. Patient has night pain, worsening of pain with activity and weight bearing, pain that interfers with activities of daily living, and pain with passive range of motion. Patient has evidence of  essentially a fused hip with flattening of the femoral head and massive osteophyte formation  by imaging studies. This condition presents safety issues increasing the risk of falls. There is no current active infection.  Patient Active Problem List   Diagnosis Date Noted   Unilateral primary osteoarthritis, right hip 10/07/2020   Bilateral primary osteoarthritis of hip 09/30/2020   Morbid exogenous obesity (HCC) 09/30/2020   BMI 50.0-59.9, adult [facility]) 09/30/2020   Lapband V[employer]ard 11cm June 2007 [place], [place], [place]) 09/12/2013    Past Medical History:  Diagnosis Date   Anxiety    Asthma    Seasonal   Hypothyroidism    Pneumonia    PONV (postoperative nausea and vomiting)     Past Surgical History:  Procedure Laterality Date   ABDOMINAL HYSTERECTOMY     APPENDECTOMY     BARIATRIC SURGERY     lapband   CHOLECYSTECTOMY     FOOT SURGERY Left    tendon repair   MASS EXCISION     left underarm    Prior to Admission medications   Medication Sig Start Date End Date Taking?  Authorizing Provider  albuterol (ACCUNEB) 1.25 MG/3ML nebulizer solution Take 1 ampule by nebulization every 6 (six) hours as needed for wheezing.   Yes [provider]  albuterol (VENTOLIN HFA) 108 (90 Base) MCG/ACT inhaler Inhale 2 puffs into the lungs every 6 (six) hours as needed for wheezing or shortness of breath.   Yes [provider]  cyclobenzaprine (FLEXERIL) 10 MG tablet Take 10 mg by mouth daily as needed for muscle spasms. 05/02/22  Yes [provider]  diclofenac Sodium (VOLTAREN) 1 % GEL Apply 1 Application topically 2 (two) times daily. 05/10/22  Yes [provider]  estradiol (ESTRACE) 0.1 MG/GM vaginal cream Place 2 g vaginally 3 (three) times a week. 05/19/22  Yes [provider]  etodolac (LODINE) 500 MG tablet Take 500 mg by mouth 2 (two) times daily. 05/19/22  Yes [provider]  HYDROcodone-acetaminophen (NORCO) 7.5-325 MG tablet Take 1 tablet by mouth daily as needed for pain. 05/02/22  Yes [provider]  levothyroxine (SYNTHROID) 50 MCG tablet Take 50 mcg by mouth at bedtime. 03/20/22  Yes [provider]  loratadine (CLARITIN) 10 MG tablet Take 10 mg by mouth daily as needed for allergies.   Yes [provider]  mometasone (NASONEX) 50 MCG/ACT nasal spray Place 2 sprays into the nose daily as needed (allergies).   Yes [provider]  topiramate (TOPAMAX) 50 MG tablet Take 50 mg by mouth at bedtime.   Yes [provider]  zolpidem (AMBIEN) 10  MG tablet Take 10 mg by mouth at bedtime.   Yes [provider]    Allergies  Allergen Reactions   Augmentin [Amoxicillin-Pot Clavulanate] Nausea And Vomiting   Valium [Diazepam] Other (See Comments)    Makes psychotic   Erythromycin Palpitations   Predicort [Prednisolone] Anxiety    Heart race    Social History   Socioeconomic History   Marital status: Married    Spouse name: Not on file   Number of children: Not on  file   Years of education: Not on file   Highest education level: Not on file  Occupational History   Not on file  Tobacco Use   Smoking status: Never   Smokeless tobacco: Never  Vaping Use   Vaping Use: Never used  Substance and Sexual Activity   Alcohol use: Yes    Comment: occasion   Drug use: No   Sexual activity: Not on file  Other Topics Concern   Not on file  Social History Narrative   Not on file   Social Determinants of Health   Financial Resource Strain: Not on file  Food Insecurity: Not on file  Transportation Needs: Not on file  Physical Activity: Not on file  Stress: Not on file  Social Connections: Not on file  Intimate Partner Violence: Not on file    Tobacco Use: Low Risk  (05/30/2022)   Patient History    Smoking Tobacco Use: Never    Smokeless Tobacco Use: Never    Passive Exposure: Not on file   Social History   Substance and Sexual Activity  Alcohol Use Yes   Comment: occasion    Family History  Problem Relation Age of Onset   Cancer Mother        colon   Heart disease Father     Review of Systems  Constitutional:  Negative for chills and fever.  HENT: Negative.    Eyes: Negative.   Respiratory:  Negative for cough and shortness of breath.   Cardiovascular:  Negative for chest pain and palpitations.  Gastrointestinal:  Negative for abdominal pain, constipation, diarrhea, nausea and vomiting.  Genitourinary:  Negative for dysuria, frequency and urgency.  Musculoskeletal:  Positive for joint pain.  Skin:  Negative for rash.    Objective:  Physical Exam: Well nourished and well developed.  General: Alert and oriented x3, cooperative and pleasant, no acute distress.  Head: normocephalic, atraumatic, neck supple.  Eyes: EOMI.  Abdomen: non-tender to palpation and soft, normoactive bowel sounds. Musculoskeletal: The patient is ambulating with a walker with the hip flexed.   Right Hip Exam:  The range of motion: Flexion to 90  degrees, Internal Rotation to 0 degrees, External Rotation to 0 degrees, and abduction to 10 degrees without discomfort. She can not move beyond that.  There is no tenderness over the greater trochanteric bursa.   Left Hip Exam:  The range of motion: Flexion to 90 degrees, Internal Rotation to 20 degrees, External Rotation to 20 degrees, and abduction to 20 degrees without discomfort.  There is no tenderness over the greater trochanteric bursa  Calves soft and nontender. Motor function intact in LE. Strength 5/5 LE bilaterally. Neuro: Distal pulses 2+. Sensation to light touch intact in LE.  Vital signs in last 24 hours: Temp:  [98.6 F (37 C)] 98.6 F (37 C) (08/08 1002) Pulse Rate:  [80] 80 (08/08 1002) Resp:  [16] 16 (08/08 1002) BP: (141)/(98) 141/98 (08/08 1002) SpO2:  [100 %] 100 % (08/08  1002)  Imaging Review Plain radiographs demonstrate severe degenerative joint disease of the right hip. The bone quality appears to be adequate for age and reported activity level.  Assessment/Plan:  End stage arthritis, right hip  The patient history, physical examination, clinical judgement of the provider and imaging studies are consistent with end stage degenerative joint disease of the right hip and total hip arthroplasty is deemed medically necessary. The treatment options including medical management, injection therapy, arthroscopy and arthroplasty were discussed at length. The risks and benefits of total hip arthroplasty were presented and reviewed. The risks due to aseptic loosening, infection, stiffness, dislocation/subluxation, thromboembolic complications and other imponderables were discussed. The patient acknowledged the explanation, agreed to proceed with the plan and consent was signed. Patient is being admitted for inpatient treatment for surgery, pain control, PT, OT, prophylactic antibiotics, VTE prophylaxis, progressive ambulation and ADLs and discharge planning.The patient is  planning to be discharged  home .  Therapy Plans: HEP Disposition: Home with Husband Planned DVT Prophylaxis: Aspirin 325 mg BID DME Needed: RW PCP: Lanier Prude, PA (appt notes from 06/13 and 07/11) TXA: IV Allergies: augmentin (N/V), valium (psychosis), erythromycin (palpitations), prednisone (tachycardia) Anesthesia Concerns: None BMI: 42.5 Last HgbA1c: not diabetic  Pharmacy: [facility] ([address] in [facility])  Other: -on hydrocodone 7.5mg  TID - discussed oxycodone post-op and increasing flexeril to TID  - Patient was instructed on what medications to stop prior to surgery. - Follow-up visit in 2 weeks with Dr. Wynelle Link - Begin physical therapy following surgery - Pre-operative lab work as pre-surgical testing - Prescriptions will be provided in hospital at time of discharge  R. Jaynie Bream, PA-C Orthopedic Surgery [facility] [place]

## 2022-05-31 NOTE — Anesthesia Preprocedure Evaluation (Addendum)
Anesthesia Evaluation  Patient identified by MRN, date of birth, ID band Patient awake    Reviewed: Allergy & Precautions, NPO status , Patient's Chart, lab work & pertinent test results  History of Anesthesia Complications (+) PONV and history of anesthetic complications  Airway Mallampati: III  TM Distance: >3 FB Neck ROM: Full    Dental  (+) Teeth Intact, Dental Advisory Given   Pulmonary asthma ,    breath sounds clear to auscultation       Cardiovascular negative cardio ROS   Rhythm:Regular Rate:Normal     Neuro/Psych Anxiety negative neurological ROS     GI/Hepatic negative GI ROS, Neg liver ROS,   Endo/Other  Hypothyroidism   Renal/GU negative Renal ROS     Musculoskeletal  (+) Arthritis ,   Abdominal (+) + obese,   Peds  Hematology negative hematology ROS (+)   Anesthesia Other Findings   Reproductive/Obstetrics                            Anesthesia Physical Anesthesia Plan  ASA: 2  Anesthesia Plan: Spinal   Post-op Pain Management:    Induction: Intravenous  PONV Risk Score and Plan: 4 or greater and Ondansetron, Dexamethasone and Midazolam  Airway Management Planned: Natural Airway and Simple Face Mask  Additional Equipment: None  Intra-op Plan:   Post-operative Plan:   Informed Consent: I have reviewed the patients History and Physical, chart, labs and discussed the procedure including the risks, benefits and alternatives for the proposed anesthesia with the patient or authorized representative who has indicated his/her understanding and acceptance.       Plan Discussed with: CRNA  Anesthesia Plan Comments: (See APP note by Joslyn Hy, FNP   Lab Results      Component                Value               Date                      WBC                      8.4                 05/30/2022                HGB                      13.4                05/30/2022                 HCT                      41.7                05/30/2022                MCV                      92.7                05/30/2022                PLT  365                 05/30/2022           )      Anesthesia Quick Evaluation

## 2022-05-31 NOTE — Progress Notes (Signed)
Anesthesia Chart Review:   Case: [id number] Date/Time: 06/12/22 1339   Procedure: TOTAL HIP ARTHROPLASTY ANTERIOR APPROACH (Right: Hip)   Anesthesia type: Choice   Pre-op diagnosis: right hip osteoarthritis   Location: [facility] ROOM 09 / [facility]   Surgeons: Ollen Gross, MD       DISCUSSION: Pt is 56 years old with hx asthma, obesity. S/p bariatric surgery.   VS: BP (!) 141/98   Pulse 80   Temp 37 C (Oral)   Resp 16   Ht 5\' 3"  (1.6 m)   Wt 110.7 kg   SpO2 100%   BMI 43.22 kg/m   PROVIDERS: - PCP is , Shellia Cleverly - Saw cardiologist [place], MD 05/11/22 for pre-op eval. Stress test ordered, results low risk. See results below.   LABS: Labs reviewed: Acceptable for surgery. (all labs ordered are listed, but only abnormal results are displayed)  Labs Reviewed  SURGICAL PCR SCREEN - Abnormal; Notable for the following components:      Result Value   Staphylococcus aureus POSITIVE (*)    All other components within normal limits  BASIC METABOLIC PANEL - Abnormal; Notable for the following components:   BUN 26 (*)    All other components within normal limits  CBC  TYPE AND SCREEN     EKG 05/11/22: Sinus arrhythmia. RBBB. Leftward axis    CV: Nuclear stress test 05/23/22 (care everywhere): 1. No reversible ischemia or infarction. 2. Normal left ventricular wall motion.  3. Left ventricular ejection fraction is 72%.  4. Non invasive risk stratification*: Low   Echo 03/06/22 (care everywhere): - Normal LV size, wall thickness, wall motion and systolic function with  ejection fraction 55-60%  - Normal left ventricular diastolic function and left atrial pressure.  - The right ventricle is normal in size and function.  - The atria are normal in size.  - There is no significant valvular stenosis or regurgitation.  - There was insufficient TR detected to calculate RV systolic pressure.  - IVC size was normal.    Past Medical History:  Diagnosis Date   Anxiety     Asthma    Seasonal   Hypothyroidism    Pneumonia    PONV (postoperative nausea and vomiting)     Past Surgical History:  Procedure Laterality Date   ABDOMINAL HYSTERECTOMY     APPENDECTOMY     BARIATRIC SURGERY     lapband   CHOLECYSTECTOMY     FOOT SURGERY Left    tendon repair   MASS EXCISION     left underarm    MEDICATIONS:  albuterol (ACCUNEB) 1.25 MG/3ML nebulizer solution   albuterol (VENTOLIN HFA) 108 (90 Base) MCG/ACT inhaler   cyclobenzaprine (FLEXERIL) 10 MG tablet   diclofenac Sodium (VOLTAREN) 1 % GEL   estradiol (ESTRACE) 0.1 MG/GM vaginal cream   etodolac (LODINE) 500 MG tablet   HYDROcodone-acetaminophen (NORCO) 7.5-325 MG tablet   levothyroxine (SYNTHROID) 50 MCG tablet   loratadine (CLARITIN) 10 MG tablet   mometasone (NASONEX) 50 MCG/ACT nasal spray   topiramate (TOPAMAX) 50 MG tablet   zolpidem (AMBIEN) 10 MG tablet   No current facility-administered medications for this encounter.    If no changes, I anticipate pt can proceed with surgery as scheduled.   02-20-1975, PhD, FNP-BC [facility]/Anesthesiology Phone: [phone] 05/31/2022 1:19 PM

## 2022-06-12 ENCOUNTER — Observation Stay (HOSPITAL_COMMUNITY)
Admission: RE | Admit: 2022-06-12 | Discharge: 2022-06-14 | Disposition: A | Discharge status: Home / Self Care | Payer: BC Managed Care – PPO | Attending: Orthopedic Surgery | Admitting: Orthopedic Surgery

## 2022-06-12 ENCOUNTER — Encounter (HOSPITAL_COMMUNITY)
Admission: RE | Disposition: A | Discharge status: Home / Self Care | Payer: Self-pay | Source: Home / Self Care | Attending: Orthopedic Surgery

## 2022-06-12 ENCOUNTER — Encounter (HOSPITAL_COMMUNITY): Payer: Self-pay | Admitting: Orthopedic Surgery

## 2022-06-12 ENCOUNTER — Ambulatory Visit (HOSPITAL_COMMUNITY): Payer: BC Managed Care – PPO

## 2022-06-12 ENCOUNTER — Ambulatory Visit (HOSPITAL_COMMUNITY): Payer: BC Managed Care – PPO | Admitting: Emergency Medicine

## 2022-06-12 ENCOUNTER — Observation Stay (HOSPITAL_COMMUNITY): Payer: BC Managed Care – PPO

## 2022-06-12 ENCOUNTER — Other Ambulatory Visit: Payer: Self-pay

## 2022-06-12 ENCOUNTER — Ambulatory Visit (HOSPITAL_COMMUNITY): Payer: BC Managed Care – PPO | Admitting: Certified Registered"

## 2022-06-12 DIAGNOSIS — Z79899 Other long term (current) drug therapy: Secondary | ICD-10-CM | POA: Diagnosis not present

## 2022-06-12 DIAGNOSIS — M160 Bilateral primary osteoarthritis of hip: Secondary | ICD-10-CM | POA: Diagnosis present

## 2022-06-12 DIAGNOSIS — J45909 Unspecified asthma, uncomplicated: Secondary | ICD-10-CM | POA: Diagnosis not present

## 2022-06-12 DIAGNOSIS — E039 Hypothyroidism, unspecified: Secondary | ICD-10-CM | POA: Diagnosis not present

## 2022-06-12 DIAGNOSIS — M1611 Unilateral primary osteoarthritis, right hip: Secondary | ICD-10-CM | POA: Diagnosis not present

## 2022-06-12 HISTORY — PX: TOTAL HIP ARTHROPLASTY: SHX124

## 2022-06-12 LAB — ABO/RH: ABO/RH(D): A POS

## 2022-06-12 SURGERY — ARTHROPLASTY, HIP, TOTAL, ANTERIOR APPROACH
Anesthesia: Spinal | Site: Hip | Laterality: Right

## 2022-06-12 MED ORDER — OXYCODONE HCL 5 MG PO TABS
ORAL_TABLET | ORAL | Status: AC
  Administered 2022-06-12: 5 mg via ORAL
  Filled 2022-06-12: qty 1

## 2022-06-12 MED ORDER — ZOLPIDEM TARTRATE 5 MG PO TABS
5.0000 mg | ORAL_TABLET | Freq: Every day | ORAL | Status: DC
  Administered 2022-06-13: 5 mg via ORAL
  Filled 2022-06-12: qty 1

## 2022-06-12 MED ORDER — MORPHINE SULFATE (PF) 2 MG/ML IV SOLN
1.0000 mg | INTRAVENOUS | Status: DC | PRN
  Administered 2022-06-12 – 2022-06-13 (×2): 2 mg via INTRAVENOUS
  Filled 2022-06-12 (×2): qty 1

## 2022-06-12 MED ORDER — HYDROMORPHONE HCL 1 MG/ML IJ SOLN
0.2500 mg | INTRAMUSCULAR | Status: DC | PRN
  Administered 2022-06-12 (×2): 0.5 mg via INTRAVENOUS
  Administered 2022-06-12: 0.25 mg via INTRAVENOUS
  Administered 2022-06-12: 0.5 mg via INTRAVENOUS

## 2022-06-12 MED ORDER — ONDANSETRON HCL 4 MG/2ML IJ SOLN
INTRAMUSCULAR | Status: DC | PRN
  Administered 2022-06-12: 4 mg via INTRAVENOUS

## 2022-06-12 MED ORDER — HYDROMORPHONE HCL 1 MG/ML IJ SOLN
INTRAMUSCULAR | Status: AC
  Administered 2022-06-12: 0.25 mg via INTRAVENOUS
  Filled 2022-06-12: qty 2

## 2022-06-12 MED ORDER — ACETAMINOPHEN 325 MG PO TABS
325.0000 mg | ORAL_TABLET | Freq: Four times a day (QID) | ORAL | Status: DC | PRN
  Administered 2022-06-13 – 2022-06-14 (×2): 650 mg via ORAL
  Filled 2022-06-12 (×2): qty 2

## 2022-06-12 MED ORDER — LACTATED RINGERS IV SOLN: INTRAVENOUS | Status: DC

## 2022-06-12 MED ORDER — PROPOFOL 10 MG/ML IV BOLUS
INTRAVENOUS | Status: AC
  Filled 2022-06-12: qty 20

## 2022-06-12 MED ORDER — BUPIVACAINE IN DEXTROSE 0.75-8.25 % IT SOLN
INTRATHECAL | Status: DC | PRN
  Administered 2022-06-12: 2 mL via INTRATHECAL

## 2022-06-12 MED ORDER — MIDAZOLAM HCL 2 MG/2ML IJ SOLN
INTRAMUSCULAR | Status: AC
  Filled 2022-06-12: qty 2

## 2022-06-12 MED ORDER — ACETAMINOPHEN 10 MG/ML IV SOLN
1000.0000 mg | Freq: Four times a day (QID) | INTRAVENOUS | Status: DC
  Administered 2022-06-12: 1000 mg via INTRAVENOUS
  Filled 2022-06-12: qty 100

## 2022-06-12 MED ORDER — ACETAMINOPHEN 10 MG/ML IV SOLN: 1000.0000 mg | Freq: Once | INTRAVENOUS | Status: DC | PRN

## 2022-06-12 MED ORDER — CYCLOBENZAPRINE HCL 10 MG PO TABS
10.0000 mg | ORAL_TABLET | Freq: Three times a day (TID) | ORAL | Status: DC | PRN
  Administered 2022-06-12 – 2022-06-14 (×4): 10 mg via ORAL
  Filled 2022-06-12 (×6): qty 1

## 2022-06-12 MED ORDER — PHENOL 1.4 % MT LIQD
1.0000 | OROMUCOSAL | Status: DC | PRN
Start: 2022-06-12 — End: 2022-06-14

## 2022-06-12 MED ORDER — PROPOFOL 10 MG/ML IV BOLUS
INTRAVENOUS | Status: DC | PRN
  Administered 2022-06-12: 20 mg via INTRAVENOUS
  Administered 2022-06-12: 10 mg via INTRAVENOUS
  Administered 2022-06-12: 40 mg via INTRAVENOUS
  Administered 2022-06-12 (×3): 20 mg via INTRAVENOUS

## 2022-06-12 MED ORDER — ALBUTEROL SULFATE HFA 108 (90 BASE) MCG/ACT IN AERS: 2.0000 | INHALATION_SPRAY | Freq: Four times a day (QID) | RESPIRATORY_TRACT | Status: DC | PRN

## 2022-06-12 MED ORDER — ONDANSETRON HCL 4 MG/2ML IJ SOLN
INTRAMUSCULAR | Status: AC
Start: 2022-06-12 — End: ?
  Filled 2022-06-12: qty 2

## 2022-06-12 MED ORDER — FENTANYL CITRATE (PF) 100 MCG/2ML IJ SOLN
INTRAMUSCULAR | Status: AC
  Filled 2022-06-12: qty 2

## 2022-06-12 MED ORDER — CEFAZOLIN SODIUM-DEXTROSE 2-4 GM/100ML-% IV SOLN
2.0000 g | Freq: Four times a day (QID) | INTRAVENOUS | Status: AC
  Administered 2022-06-12 – 2022-06-13 (×2): 2 g via INTRAVENOUS
  Filled 2022-06-12 (×2): qty 100

## 2022-06-12 MED ORDER — PROMETHAZINE HCL 25 MG/ML IJ SOLN: 6.2500 mg | INTRAMUSCULAR | Status: DC | PRN

## 2022-06-12 MED ORDER — METOCLOPRAMIDE HCL 5 MG/ML IJ SOLN: 5.0000 mg | Freq: Three times a day (TID) | INTRAMUSCULAR | Status: DC | PRN

## 2022-06-12 MED ORDER — 0.9 % SODIUM CHLORIDE (POUR BTL) OPTIME
TOPICAL | Status: DC | PRN
  Administered 2022-06-12: 1000 mL

## 2022-06-12 MED ORDER — DOCUSATE SODIUM 100 MG PO CAPS
100.0000 mg | ORAL_CAPSULE | Freq: Two times a day (BID) | ORAL | Status: DC
  Administered 2022-06-12 – 2022-06-14 (×4): 100 mg via ORAL
  Filled 2022-06-12 (×4): qty 1

## 2022-06-12 MED ORDER — DIPHENHYDRAMINE HCL 12.5 MG/5ML PO ELIX: 12.5000 mg | ORAL_SOLUTION | ORAL | Status: DC | PRN

## 2022-06-12 MED ORDER — POVIDONE-IODINE 10 % EX SWAB
2.0000 | Freq: Once | CUTANEOUS | Status: AC
  Administered 2022-06-12: 2 via TOPICAL

## 2022-06-12 MED ORDER — OXYCODONE HCL 5 MG PO TABS
5.0000 mg | ORAL_TABLET | ORAL | Status: DC | PRN
  Administered 2022-06-12 – 2022-06-14 (×6): 10 mg via ORAL
  Filled 2022-06-12 (×7): qty 2

## 2022-06-12 MED ORDER — MIDAZOLAM HCL 2 MG/2ML IJ SOLN
INTRAMUSCULAR | Status: DC | PRN
  Administered 2022-06-12: 2 mg via INTRAVENOUS

## 2022-06-12 MED ORDER — BUPIVACAINE-EPINEPHRINE (PF) 0.5% -1:200000 IJ SOLN
INTRAMUSCULAR | Status: DC | PRN
  Administered 2022-06-12: 30 mL

## 2022-06-12 MED ORDER — ORAL CARE MOUTH RINSE: 15.0000 mL | Freq: Once | OROMUCOSAL | Status: AC

## 2022-06-12 MED ORDER — ONDANSETRON HCL 4 MG PO TABS: 4.0000 mg | ORAL_TABLET | Freq: Four times a day (QID) | ORAL | Status: DC | PRN

## 2022-06-12 MED ORDER — DEXAMETHASONE SODIUM PHOSPHATE 10 MG/ML IJ SOLN
8.0000 mg | Freq: Once | INTRAMUSCULAR | Status: AC
  Administered 2022-06-12: 10 mg via INTRAVENOUS

## 2022-06-12 MED ORDER — PHENYLEPHRINE HCL-NACL 20-0.9 MG/250ML-% IV SOLN
INTRAVENOUS | Status: DC | PRN
  Administered 2022-06-12: 50 ug/min via INTRAVENOUS

## 2022-06-12 MED ORDER — DEXMEDETOMIDINE HCL IN NACL 80 MCG/20ML IV SOLN
INTRAVENOUS | Status: AC
  Filled 2022-06-12: qty 20

## 2022-06-12 MED ORDER — EPHEDRINE SULFATE-NACL 50-0.9 MG/10ML-% IV SOSY
PREFILLED_SYRINGE | INTRAVENOUS | Status: DC | PRN
  Administered 2022-06-12: 5 mg via INTRAVENOUS
  Administered 2022-06-12 (×2): 10 mg via INTRAVENOUS

## 2022-06-12 MED ORDER — ACETAMINOPHEN 325 MG PO TABS: 325.0000 mg | ORAL_TABLET | Freq: Once | ORAL | Status: DC | PRN

## 2022-06-12 MED ORDER — FENTANYL CITRATE (PF) 100 MCG/2ML IJ SOLN
INTRAMUSCULAR | Status: DC | PRN
  Administered 2022-06-12 (×2): 50 ug via INTRAVENOUS

## 2022-06-12 MED ORDER — MENTHOL 3 MG MT LOZG: 1.0000 | LOZENGE | OROMUCOSAL | Status: DC | PRN

## 2022-06-12 MED ORDER — ONDANSETRON HCL 4 MG/2ML IJ SOLN: 4.0000 mg | Freq: Four times a day (QID) | INTRAMUSCULAR | Status: DC | PRN

## 2022-06-12 MED ORDER — ALBUMIN HUMAN 5 % IV SOLN: INTRAVENOUS | Status: DC | PRN

## 2022-06-12 MED ORDER — LORATADINE 10 MG PO TABS: 10.0000 mg | ORAL_TABLET | Freq: Every day | ORAL | Status: DC | PRN

## 2022-06-12 MED ORDER — AMISULPRIDE (ANTIEMETIC) 5 MG/2ML IV SOLN: 10.0000 mg | Freq: Once | INTRAVENOUS | Status: DC | PRN

## 2022-06-12 MED ORDER — DEXAMETHASONE SODIUM PHOSPHATE 10 MG/ML IJ SOLN
INTRAMUSCULAR | Status: AC
  Filled 2022-06-12: qty 1

## 2022-06-12 MED ORDER — TRANEXAMIC ACID-NACL 1000-0.7 MG/100ML-% IV SOLN
1000.0000 mg | INTRAVENOUS | Status: AC
  Administered 2022-06-12: 1000 mg via INTRAVENOUS
  Filled 2022-06-12: qty 100

## 2022-06-12 MED ORDER — MEPERIDINE HCL 50 MG/ML IJ SOLN: 6.2500 mg | INTRAMUSCULAR | Status: DC | PRN

## 2022-06-12 MED ORDER — SODIUM CHLORIDE 0.9 % IV SOLN: INTRAVENOUS | Status: DC

## 2022-06-12 MED ORDER — PROPOFOL 500 MG/50ML IV EMUL
INTRAVENOUS | Status: DC | PRN
  Administered 2022-06-12: 100 ug/kg/min via INTRAVENOUS

## 2022-06-12 MED ORDER — CEFAZOLIN SODIUM-DEXTROSE 2-4 GM/100ML-% IV SOLN
2.0000 g | INTRAVENOUS | Status: AC
  Administered 2022-06-12: 2 g via INTRAVENOUS
  Filled 2022-06-12: qty 100

## 2022-06-12 MED ORDER — FLEET ENEMA 7-19 GM/118ML RE ENEM: 1.0000 | ENEMA | Freq: Once | RECTAL | Status: DC | PRN

## 2022-06-12 MED ORDER — BUPIVACAINE-EPINEPHRINE (PF) 0.5% -1:200000 IJ SOLN
INTRAMUSCULAR | Status: AC
Start: 2022-06-12 — End: ?
  Filled 2022-06-12: qty 30

## 2022-06-12 MED ORDER — CHLORHEXIDINE GLUCONATE 0.12 % MT SOLN
15.0000 mL | Freq: Once | OROMUCOSAL | Status: AC
  Administered 2022-06-12: 15 mL via OROMUCOSAL

## 2022-06-12 MED ORDER — DEXMEDETOMIDINE (PRECEDEX) IN NS 20 MCG/5ML (4 MCG/ML) IV SYRINGE
PREFILLED_SYRINGE | INTRAVENOUS | Status: DC | PRN
  Administered 2022-06-12: 4 ug via INTRAVENOUS
  Administered 2022-06-12: 8 ug via INTRAVENOUS

## 2022-06-12 MED ORDER — BISACODYL 10 MG RE SUPP: 10.0000 mg | Freq: Every day | RECTAL | Status: DC | PRN

## 2022-06-12 MED ORDER — TRAMADOL HCL 50 MG PO TABS
50.0000 mg | ORAL_TABLET | Freq: Four times a day (QID) | ORAL | Status: DC
  Administered 2022-06-12 – 2022-06-14 (×7): 50 mg via ORAL
  Filled 2022-06-12 (×7): qty 1

## 2022-06-12 MED ORDER — METOCLOPRAMIDE HCL 5 MG PO TABS: 5.0000 mg | ORAL_TABLET | Freq: Three times a day (TID) | ORAL | Status: DC | PRN

## 2022-06-12 MED ORDER — FLUTICASONE PROPIONATE 50 MCG/ACT NA SUSP: 2.0000 | Freq: Every day | NASAL | Status: DC | PRN

## 2022-06-12 MED ORDER — ASPIRIN 325 MG PO TBEC
325.0000 mg | DELAYED_RELEASE_TABLET | Freq: Two times a day (BID) | ORAL | Status: DC
  Administered 2022-06-13 – 2022-06-14 (×3): 325 mg via ORAL
  Filled 2022-06-12 (×3): qty 1

## 2022-06-12 MED ORDER — ALBUTEROL SULFATE (2.5 MG/3ML) 0.083% IN NEBU: 1.5000 mL | INHALATION_SOLUTION | Freq: Four times a day (QID) | RESPIRATORY_TRACT | Status: DC | PRN

## 2022-06-12 MED ORDER — ACETAMINOPHEN 160 MG/5ML PO SOLN: 325.0000 mg | Freq: Once | ORAL | Status: DC | PRN

## 2022-06-12 MED ORDER — TRAMADOL HCL 50 MG PO TABS
ORAL_TABLET | ORAL | Status: AC
  Administered 2022-06-12: 50 mg via ORAL
  Filled 2022-06-12: qty 1

## 2022-06-12 MED ORDER — TOPIRAMATE 25 MG PO TABS
50.0000 mg | ORAL_TABLET | Freq: Every day | ORAL | Status: DC
  Administered 2022-06-12 – 2022-06-13 (×2): 50 mg via ORAL
  Filled 2022-06-12 (×3): qty 2

## 2022-06-12 MED ORDER — LEVOTHYROXINE SODIUM 50 MCG PO TABS
50.0000 ug | ORAL_TABLET | Freq: Every day | ORAL | Status: DC
  Administered 2022-06-12 – 2022-06-13 (×2): 50 ug via ORAL
  Filled 2022-06-12 (×2): qty 1

## 2022-06-12 MED ORDER — PHENYLEPHRINE 80 MCG/ML (10ML) SYRINGE FOR IV PUSH (FOR BLOOD PRESSURE SUPPORT)
PREFILLED_SYRINGE | INTRAVENOUS | Status: DC | PRN
  Administered 2022-06-12 (×3): 80 ug via INTRAVENOUS

## 2022-06-12 MED ORDER — LIDOCAINE 2% (20 MG/ML) 5 ML SYRINGE
INTRAMUSCULAR | Status: DC | PRN
  Administered 2022-06-12: 40 mg via INTRAVENOUS

## 2022-06-12 MED ORDER — POLYETHYLENE GLYCOL 3350 17 G PO PACK
17.0000 g | PACK | Freq: Every day | ORAL | Status: DC | PRN
Start: 2022-06-12 — End: 2022-06-14

## 2022-06-12 SURGICAL SUPPLY — 47 items
BAG COUNTER SPONGE SURGICOUNT (BAG) IMPLANT
BAG DECANTER FOR FLEXI CONT (MISCELLANEOUS) IMPLANT
BAG ZIPLOCK 12X15 (MISCELLANEOUS) IMPLANT
BALL HIP CERAMIC (Hips) IMPLANT
BLADE SAG 18X100X1.27 (BLADE) ×1 IMPLANT
CABLE CERLAGE W/CRIMP 1.8 (Cable) IMPLANT
COVER PERINEAL POST (MISCELLANEOUS) ×1 IMPLANT
COVER SURGICAL LIGHT HANDLE (MISCELLANEOUS) ×1 IMPLANT
CUP ACET PINNACLE SECTR 50MM (Hips) IMPLANT
DRAPE FOOT SWITCH (DRAPES) ×1 IMPLANT
DRAPE STERI IOBAN 125X83 (DRAPES) ×1 IMPLANT
DRAPE U-SHAPE 47X51 STRL (DRAPES) ×2 IMPLANT
DRSG AQUACEL AG ADV 3.5X10 (GAUZE/BANDAGES/DRESSINGS) ×1 IMPLANT
DURAPREP 26ML APPLICATOR (WOUND CARE) ×1 IMPLANT
ELECT REM PT RETURN 15FT ADLT (MISCELLANEOUS) ×1 IMPLANT
GLOVE BIO SURGEON STRL SZ 6.5 (GLOVE) IMPLANT
GLOVE BIO SURGEON STRL SZ7.5 (GLOVE) IMPLANT
GLOVE BIO SURGEON STRL SZ8 (GLOVE) ×1 IMPLANT
GLOVE BIOGEL PI IND STRL 6.5 (GLOVE) IMPLANT
GLOVE BIOGEL PI IND STRL 7.0 (GLOVE) IMPLANT
GLOVE BIOGEL PI IND STRL 8 (GLOVE) ×1 IMPLANT
GLOVE BIOGEL PI INDICATOR 6.5 (GLOVE)
GLOVE BIOGEL PI INDICATOR 7.0 (GLOVE)
GLOVE BIOGEL PI INDICATOR 8 (GLOVE) ×1
GOWN STRL REUS W/ TWL LRG LVL3 (GOWN DISPOSABLE) ×1 IMPLANT
GOWN STRL REUS W/ TWL XL LVL3 (GOWN DISPOSABLE) IMPLANT
GOWN STRL REUS W/TWL LRG LVL3 (GOWN DISPOSABLE) ×1
GOWN STRL REUS W/TWL XL LVL3 (GOWN DISPOSABLE)
HIP BALL CERAMIC (Hips) ×1 IMPLANT
HOLDER FOLEY CATH W/STRAP (MISCELLANEOUS) ×1 IMPLANT
KIT TURNOVER KIT A (KITS) IMPLANT
LINER MARATHON 32 50 (Hips) IMPLANT
MANIFOLD NEPTUNE II (INSTRUMENTS) ×1 IMPLANT
PACK ANTERIOR HIP CUSTOM (KITS) ×1 IMPLANT
PENCIL SMOKE EVACUATOR COATED (MISCELLANEOUS) ×1 IMPLANT
PINNACLE SECTOR CUP 50MM (Hips) ×1 IMPLANT
SPIKE FLUID TRANSFER (MISCELLANEOUS) ×1 IMPLANT
STEM FEM ACTIS STD SZ4 (Stem) IMPLANT
STRIP CLOSURE SKIN 1/2X4 (GAUZE/BANDAGES/DRESSINGS) ×1 IMPLANT
SUT ETHIBOND NAB CT1 #1 30IN (SUTURE) ×1 IMPLANT
SUT MNCRL AB 4-0 PS2 18 (SUTURE) ×1 IMPLANT
SUT STRATAFIX 0 PDS 27 VIOLET (SUTURE) ×1
SUT VIC AB 2-0 CT1 27 (SUTURE) ×2
SUT VIC AB 2-0 CT1 TAPERPNT 27 (SUTURE) ×2 IMPLANT
SUTURE STRATFX 0 PDS 27 VIOLET (SUTURE) ×1 IMPLANT
TRAY FOLEY MTR SLVR 16FR STAT (SET/KITS/TRAYS/PACK) ×1 IMPLANT
TUBE SUCTION HIGH CAP CLEAR NV (SUCTIONS) ×1 IMPLANT

## 2022-06-12 NOTE — Anesthesia Postprocedure Evaluation (Signed)
Anesthesia Post Note  Patient: Audrey Bradley  Procedure(s) Performed: TOTAL HIP ARTHROPLASTY ANTERIOR APPROACH (Right: Hip)     Patient location during evaluation: PACU Anesthesia Type: Spinal Level of consciousness: oriented and awake and alert Pain management: pain level controlled Vital Signs Assessment: post-procedure vital signs reviewed and stable Respiratory status: spontaneous breathing, respiratory function stable and patient connected to nasal cannula oxygen Cardiovascular status: blood pressure returned to baseline and stable Postop Assessment: no headache, no backache and no apparent nausea or vomiting Anesthetic complications: no   No notable events documented.  Last Vitals:  Vitals:   06/12/22 1700 06/12/22 1715  BP: (!) 103/52 106/64  Pulse: (!) 59 60  Resp: 16 17  Temp:    SpO2: 100% 98%    Last Pain:  Vitals:   06/12/22 1715  TempSrc:   PainSc: 6                  [name]

## 2022-06-12 NOTE — Transfer of Care (Signed)
Immediate Anesthesia Transfer of Care Note  Patient: Audrey Bradley  Procedure(s) Performed: TOTAL HIP ARTHROPLASTY ANTERIOR APPROACH (Right: Hip)  Patient Location: PACU  Anesthesia Type:Spinal  Level of Consciousness: awake, alert  and patient cooperative  Airway & Oxygen Therapy: Patient Spontanous Breathing and Patient connected to face mask oxygen  Post-op Assessment: Report given to RN and Post -op Vital signs reviewed and stable  Post vital signs: Reviewed and stable  Last Vitals:  Vitals Value Taken Time  BP    Temp    Pulse    Resp    SpO2      Last Pain:  Vitals:   06/12/22 1155  TempSrc: Oral      Patients Stated Pain Goal: 4 (06/12/22 1149)  Complications: No notable events documented.

## 2022-06-12 NOTE — Anesthesia Procedure Notes (Signed)
Spinal  Start time: 06/12/2022 2:11 PM End time: 06/12/2022 2:08 PM Reason for block: surgical anesthesia Staffing Performed: anesthesiologist  Anesthesiologist: [name], MD Performed by: [name], MD Authorized by: [name], MD   Preanesthetic Checklist Completed: patient identified, IV checked, site marked, risks and benefits discussed, surgical consent, monitors and equipment checked, pre-op evaluation and timeout performed Spinal Block Patient position: sitting Prep: DuraPrep and site prepped and draped Location: L3-4 Injection technique: single-shot Needle Needle type: Pencan  Needle gauge: 24 G Needle length: 10 cm Needle insertion depth: 10 cm Assessment Events: CSF return and second provider Additional Notes Patient had some discomfort, poor positioning throughout. No immediate complications.  CRNA x1, MDA x1.   Functioning IV was confirmed and monitors were applied. Sterile prep and drape, including hand hygiene and sterile gloves were used. The patient was positioned and the back was prepped. The skin was anesthetized with lidocaine. Free flow of clear CSF was obtained prior to injecting local anesthetic into the CSF. The spinal needle aspirated freely following injection. The needle was carefully withdrawn. The patient tolerated the procedure well.

## 2022-06-12 NOTE — Interval H&P Note (Signed)
History and Physical Interval Note:  06/12/2022 12:02 PM  Audrey Bradley  has presented today for surgery, with the diagnosis of right hip osteoarthritis.  The various methods of treatment have been discussed with the patient and family. After consideration of risks, benefits and other options for treatment, the patient has consented to  Procedure(s): TOTAL HIP ARTHROPLASTY ANTERIOR APPROACH (Right) as a surgical intervention.  The patient's history has been reviewed, patient examined, no change in status, stable for surgery.  I have reviewed the patient's chart and labs.  Questions were answered to the patient's satisfaction.     Homero Fellers Terrionna Bridwell

## 2022-06-12 NOTE — Anesthesia Procedure Notes (Signed)
Procedure Name: MAC Date/Time: 06/12/2022 1:57 PM  Performed by: Eben Burow, CRNAPre-anesthesia Checklist: Patient identified, Emergency Drugs available, Suction available, Patient being monitored and Timeout performed Oxygen Delivery Method: Simple face mask Placement Confirmation: positive ETCO2

## 2022-06-12 NOTE — Op Note (Signed)
OPERATIVE REPORT- TOTAL HIP ARTHROPLASTY   PREOPERATIVE DIAGNOSIS: Osteoarthritis of the Right hip.   POSTOPERATIVE DIAGNOSIS: Osteoarthritis of the Right  hip.   PROCEDURE: Right total hip arthroplasty, anterior approach.   SURGEON: Ollen Gross, MD   ASSISTANT: Arcola Jansky, PA-C  ANESTHESIA:  Spinal  ESTIMATED BLOOD LOSS:-625 mL    DRAINS: None  COMPLICATIONS: Calcar fracture above level of lesser trochanter   CONDITION: PACU - hemodynamically stable.   BRIEF CLINICAL NOTE: Audrey Bradley is a 56 y.o. female who has advanced end-  stage arthritis of their Right  hip with progressively worsening pain and  dysfunction.The patient has failed nonoperative management and presents for  total hip arthroplasty.   PROCEDURE IN DETAIL: After successful administration of spinal  anesthetic, the traction boots for the [facility] bed were placed on both  feet and the patient was placed onto the [facility] bed, boots placed into the leg  holders. The Right hip was then isolated from the perineum with plastic  drapes and prepped and draped in the usual sterile fashion. ASIS and  greater trochanter were marked and a oblique incision was made, starting  at about 1 cm lateral and 2 cm distal to the ASIS and coursing towards  the anterior cortex of the femur. The skin was cut with a 10 blade  through subcutaneous tissue to the level of the fascia overlying the  tensor fascia lata muscle. The fascia was then incised in line with the  incision at the junction of the anterior third and posterior 2/3rd. The  muscle was teased off the fascia and then the interval between the TFL  and the rectus was developed. The Hohmann retractor was then placed at  the top of the femoral neck over the capsule. The vessels overlying the  capsule were cauterized and the fat on top of the capsule was removed.  A Hohmann retractor was then placed anterior underneath the rectus  femoris to give exposure to the  entire anterior capsule. A T-shaped  capsulotomy was performed. The edges were tagged and the femoral head  was identified.       Osteophytes are removed off the superior acetabulum.  The femoral neck was then cut in situ with an oscillating saw. Traction  was then applied to the left lower extremity utilizing the [facility]  traction. The femoral head was then removed. Retractors were placed  around the acetabulum and then circumferential removal of the labrum was  performed. Osteophytes were also removed. Reaming starts at 47 mm to  medialize and  Increased in 2 mm increments to 49 mm. We reamed in  approximately 40 degrees of abduction, 20 degrees anteversion. A 50 mm  pinnacle acetabular shell was then impacted in anatomic position under  fluoroscopic guidance with excellent purchase. We did not need to place  any additional dome screws. A 32 mm neutral + 4 marathon liner was then  placed into the acetabular shell.       The femoral lift was then placed along the lateral aspect of the femur  just distal to the vastus ridge. The leg was  externally rotated and capsule  was stripped off the inferior aspect of the femoral neck down to the  level of the lesser trochanter, this was done with electrocautery. The femur was lifted after this was performed. The  leg was then placed in an extended and adducted position essentially delivering the femur. We also removed the capsule superiorly and  the piriformis from the piriformis fossa to gain excellent exposure of the  proximal femur. Rongeur was used to remove some cancellous bone to get  into the lateral portion of the proximal femur for placement of the  initial starter reamer. The starter broaches was placed  the starter broach  and was shown to go down the center of the canal. Broaching  with the Actis system was then performed starting at size 0  coursing  Up to size 4. A size 4 had excellent torsional and rotational  and axial stability. The  trial standard offset neck was then placed  with a 32 + 5 trial head. The hip was then reduced. We confirmed that  the stem was in the canal both on AP and lateral x-rays. It also has excellent sizing. The hip was reduced with outstanding stability through full extension and full external rotation.. AP pelvis was taken and the leg lengths were measured and found to be equal. Hip was then dislocated again and the femoral head and neck removed. The  femoral broach was removed. Size 4 Actis stem with a standard offset  neck was then impacted into the femur following native anteversion. Has  excellent purchase in the canal. Excellent torsional and rotational and  axial stability. It is noted that there was a small calcar fracture posteriorly which did not extend to the Lesser trochanter and did not impact the stem stability. I placed a Zimmer cable around the calcar  To prevent propagation of the fracture. The 32 + 5 ceramic head was placed and the hip  reduced with outstanding stability. Again AP pelvis was taken and it  confirmed that the leg lengths were equal. The wound was then copiously  irrigated with saline solution and the capsule reattached and repaired  with Ethibond suture. 30 ml of .25% Bupivicaine was  injected into the capsule and into the edge of the tensor fascia lata as well as subcutaneous tissue. The fascia overlying the tensor fascia lata was then closed with a running #1 V-Loc. Subcu was closed with interrupted 2-0 Vicryl and subcuticular running 4-0 Monocryl. Incision was cleaned  and dried. Steri-Strips and a bulky sterile dressing applied. The patient was awakened and transported to  recovery in stable condition.        Please note that a surgical assistant was a medical necessity for this procedure to perform it in a safe and expeditious manner. Assistant was necessary to provide appropriate retraction of vital neurovascular structures and to prevent femoral fracture and allow  for anatomic placement of the prosthesis.  Ollen Gross, M.D.

## 2022-06-12 NOTE — Discharge Instructions (Addendum)
°Frank [name], MD °Total Joint Specialist °[facility] °[address] °[place], [place] [address] °[phone] ° °ANTERIOR APPROACH TOTAL HIP REPLACEMENT POSTOPERATIVE DIRECTIONS ° ° ° ° °Hip Rehabilitation, Guidelines Following Surgery  °The results of a hip operation are greatly improved after range of motion and muscle strengthening exercises. Follow all safety measures which are given to protect your hip. If any of these exercises cause increased pain or swelling in your joint, decrease the amount until you are comfortable again. Then slowly increase the exercises. Call your caregiver if you have problems or questions.  ° °BLOOD CLOT PREVENTION °Take a 325 mg Aspirin two times a day for three weeks following surgery. Then take an 81 mg Aspirin once a day for three weeks. Then discontinue Aspirin. °You may resume your vitamins/supplements upon discharge from the hospital. °Do not take any NSAIDs (Advil, Aleve, Ibuprofen, Meloxicam, etc.) until you have discontinued the 325 mg Aspirin. ° °HOME CARE INSTRUCTIONS  °Remove items at home which could result in a fall. This includes throw rugs or furniture in walking pathways.  °ICE to the affected hip as frequently as 20-30 minutes an hour and then as needed for pain and swelling. Continue to use ice on the hip for pain and swelling from surgery. You may notice swelling that will progress down to the foot and ankle. This is normal after surgery. Elevate the leg when you are not up walking on it.   °Continue to use the breathing machine which will help keep your temperature down.  It is common for your temperature to cycle up and down following surgery, especially at night when you are not up moving around and exerting yourself.  The breathing machine keeps your lungs expanded and your temperature down. ° °DIET °You may resume your previous home diet once your are discharged from the hospital. ° °DRESSING / WOUND CARE / SHOWERING °You have  an adhesive waterproof bandage over the incision. Leave this in place until your first follow-up appointment. Once you remove this you will not need to place another bandage.  °You may begin showering 3 days following surgery, but do not submerge the incision under water. ° °ACTIVITY °For the first 3-5 days, it is important to rest and keep the operative leg elevated. You should, as a general rule, rest for 50 minutes and walk/stretch for 10 minutes per hour. After 5 days, you may slowly increase activity as tolerated.  °Perform the exercises you were provided twice a day for about 15-20 minutes each session. Begin these 2 days following surgery. °Walk with your walker as instructed. Use the walker until you are comfortable transitioning to a cane. Walk with the cane in the opposite hand of the operative leg. You may discontinue the cane once you are comfortable and walking steadily. °Avoid periods of inactivity such as sitting longer than an hour when not asleep. This helps prevent blood clots.  °Do not drive a car for 6 weeks or until released by your surgeon.  °Do not drive while taking narcotics. ° °TED HOSE STOCKINGS °Wear the elastic stockings on both legs for three weeks following surgery during the day. You may remove them at night while sleeping. ° °WEIGHT BEARING °Weight bearing as tolerated with assist device (walker, cane, etc) as directed, use it as long as suggested by your surgeon or therapist, typically at least 4-6 weeks. ° °POSTOPERATIVE CONSTIPATION PROTOCOL °Constipation - defined medically as fewer than three stools per week and severe constipation as   less than one stool per week. ° °One of the most common issues patients have following surgery is constipation.  Even if you have a regular bowel pattern at home, your normal regimen is likely to be disrupted due to multiple reasons following surgery.  Combination of anesthesia, postoperative narcotics, change in appetite and fluid intake all can  affect your bowels.  In order to avoid complications following surgery, here are some recommendations in order to help you during your recovery period. ° °Colace (docusate) - Pick up an over-the-counter form of Colace or another stool softener and take twice a day as long as you are requiring postoperative pain medications.  Take with a full glass of water daily.  If you experience loose stools or diarrhea, hold the colace until you stool forms back up.  If your symptoms do not get better within 1 week or if they get worse, check with your doctor. °Dulcolax (bisacodyl) - Pick up over-the-counter and take as directed by the product packaging as needed to assist with the movement of your bowels.  Take with a full glass of water.  Use this product as needed if not relieved by Colace only.  °MiraLax (polyethylene glycol) - Pick up over-the-counter to have on hand.  MiraLax is a solution that will increase the amount of water in your bowels to assist with bowel movements.  Take as directed and can mix with a glass of water, juice, soda, coffee, or tea.  Take if you go more than two days without a movement.Do not use MiraLax more than once per day. Call your doctor if you are still constipated or irregular after using this medication for 7 days in a row. ° °If you continue to have problems with postoperative constipation, please contact the office for further assistance and recommendations.  If you experience "the worst abdominal pain ever" or develop nausea or vomiting, please contact the office immediatly for further recommendations for treatment. ° °ITCHING ° If you experience itching with your medications, try taking only a single pain pill, or even half a pain pill at a time.  You can also use Benadryl over the counter for itching or also to help with sleep.  ° °MEDICATIONS °See your medication summary on the “After Visit Summary” that the nursing staff will review with you prior to discharge.  You may have some home  medications which will be placed on hold until you complete the course of blood thinner medication.  It is important for you to complete the blood thinner medication as prescribed by your surgeon.  Continue your approved medications as instructed at time of discharge. ° °PRECAUTIONS °If you experience chest pain or shortness of breath - call 911 immediately for transfer to the hospital emergency department.  °If you develop a fever greater that 101 F, purulent drainage from wound, increased redness or drainage from wound, foul odor from the wound/dressing, or calf pain - CONTACT YOUR SURGEON.   °                                                °FOLLOW-UP APPOINTMENTS °Make sure you keep all of your appointments after your operation with your surgeon and caregivers. You should call the office at the above phone number and make an appointment for approximately two weeks after the date of your surgery or on the   date instructed by your surgeon outlined in the "After Visit Summary". ° °RANGE OF MOTION AND STRENGTHENING EXERCISES  °These exercises are designed to help you keep full movement of your hip joint. Follow your caregiver's or physical therapist's instructions. Perform all exercises about fifteen times, three times per day or as directed. Exercise both hips, even if you have had only one joint replacement. These exercises can be done on a training (exercise) mat, on the floor, on a table or on a bed. Use whatever works the best and is most comfortable for you. Use music or television while you are exercising so that the exercises are a pleasant break in your day. This will make your life better with the exercises acting as a break in routine you can look forward to.  °Lying on your back, slowly slide your foot toward your buttocks, raising your knee up off the floor. Then slowly slide your foot back down until your leg is straight again.  °Lying on your back spread your legs as far apart as you can without causing  discomfort.  °Lying on your side, raise your upper leg and foot straight up from the floor as far as is comfortable. Slowly lower the leg and repeat.  °Lying on your back, tighten up the muscle in the front of your thigh (quadriceps muscles). You can do this by keeping your leg straight and trying to raise your heel off the floor. This helps strengthen the largest muscle supporting your knee.  °Lying on your back, tighten up the muscles of your buttocks both with the legs straight and with the knee bent at a comfortable angle while keeping your heel on the floor.  ° °POST-OPERATIVE OPIOID TAPER INSTRUCTIONS: °It is important to wean off of your opioid medication as soon as possible. If you do not need pain medication after your surgery it is ok to stop day one. °Opioids include: °Codeine, Hydrocodone(Norco, Vicodin), Oxycodone(Percocet, oxycontin) and hydromorphone amongst others.  °Long term and even short term use of opiods can cause: °Increased pain response °Dependence °Constipation °Depression °Respiratory depression °And more.  °Withdrawal symptoms can include °Flu like symptoms °Nausea, vomiting °And more °Techniques to manage these symptoms °Hydrate well °Eat regular healthy meals °Stay active °Use relaxation techniques(deep breathing, meditating, yoga) °Do Not substitute Alcohol to help with tapering °If you have been on opioids for less than two weeks and do not have pain than it is ok to stop all together.  °Plan to wean off of opioids °This plan should start within one week post op of your joint replacement. °Maintain the same interval or time between taking each dose and first decrease the dose.  °Cut the total daily intake of opioids by one tablet each day °Next start to increase the time between doses. °The last dose that should be eliminated is the evening dose.  ° °IF YOU ARE TRANSFERRED TO A SKILLED REHAB FACILITY °If the patient is transferred to a skilled rehab facility following release from the  hospital, a list of the current medications will be sent to the facility for the patient to continue.  When discharged from the skilled rehab facility, please have the facility set up the patient's Home Health Physical Therapy prior to being released. Also, the skilled facility will be responsible for providing the patient with their medications at time of release from the facility to include their pain medication, the muscle relaxants, and their blood thinner medication. If the patient is still at the rehab facility   at time of the two week follow up appointment, the skilled rehab facility will also need to assist the patient in arranging follow up appointment in our office and any transportation needs. ° °MAKE SURE YOU:  °Understand these instructions.  °Get help right away if you are not doing well or get worse.  ° ° °DENTAL ANTIBIOTICS: ° °In most cases prophylactic antibiotics for Dental procdeures after total joint surgery are not necessary. ° °Exceptions are as follows: ° °1. History of prior total joint infection ° °2. Severely immunocompromised (Organ Transplant, cancer chemotherapy, Rheumatoid biologic °meds such as Humera) ° °3. Poorly controlled diabetes (A1C &gt; 8.0, blood glucose over 200) ° °If you have one of these conditions, contact your surgeon for an antibiotic prescription, prior to your °dental procedure.  ° ° °Pick up stool softner and laxative for home use following surgery while on pain medications. °Do not submerge incision under water. °Please use good hand washing techniques while changing dressing each day. °May shower starting three days after surgery. °Please use a clean towel to pat the incision dry following showers. °Continue to use ice for pain and swelling after surgery. °Do not use any lotions or creams on the incision until instructed by your surgeon. ° °

## 2022-06-13 DIAGNOSIS — M1611 Unilateral primary osteoarthritis, right hip: Secondary | ICD-10-CM | POA: Diagnosis not present

## 2022-06-13 LAB — CBC
HCT: 34 % — ABNORMAL LOW (ref 36.0–46.0)
Hemoglobin: 11.1 g/dL — ABNORMAL LOW (ref 12.0–15.0)
MCH: 30.2 pg (ref 26.0–34.0)
MCHC: 32.6 g/dL (ref 30.0–36.0)
MCV: 92.4 fL (ref 80.0–100.0)
Platelets: 328 10*3/uL (ref 150–400)
RBC: 3.68 MIL/uL — ABNORMAL LOW (ref 3.87–5.11)
RDW: 12.3 % (ref 11.5–15.5)
WBC: 16.3 10*3/uL — ABNORMAL HIGH (ref 4.0–10.5)
nRBC: 0 % (ref 0.0–0.2)

## 2022-06-13 LAB — BASIC METABOLIC PANEL
Anion gap: 7 (ref 5–15)
BUN: 12 mg/dL (ref 6–20)
CO2: 23 mmol/L (ref 22–32)
Calcium: 8.3 mg/dL — ABNORMAL LOW (ref 8.9–10.3)
Chloride: 109 mmol/L (ref 98–111)
Creatinine, Ser: 0.72 mg/dL (ref 0.44–1.00)
GFR, Estimated: >60 mL/min (ref >60)
Glucose, Bld: 138 mg/dL — ABNORMAL HIGH (ref 70–99)
Potassium: 4.1 mmol/L (ref 3.5–5.1)
Sodium: 139 mmol/L (ref 135–145)

## 2022-06-13 NOTE — Plan of Care (Signed)
  Problem: Education: Goal: Knowledge of the prescribed therapeutic regimen will improve Outcome: Progressing   Problem: Activity: Goal: Ability to tolerate increased activity will improve Outcome: Progressing   Problem: Pain Management: Goal: Pain level will decrease with appropriate interventions Outcome: Progressing   Problem: Safety: Goal: Ability to remain free from injury will improve Outcome: Progressing   Problem: Skin Integrity: Goal: Risk for impaired skin integrity will decrease Outcome: Progressing

## 2022-06-13 NOTE — Evaluation (Signed)
Physical Therapy Evaluation Patient Details Name: Audrey Bradley MRN: [id number] DOB: November 20, 1965 Today's Date: 06/13/2022  History of Present Illness  Pt is a 56 year old female s/p Right THA.  PMHx: anxiety, asthma, pneumonia, lapband surgery  Clinical Impression  Pt is s/p THA resulting in the deficits listed below (see PT Problem List).  Pt will benefit from skilled PT to increase their independence and safety with mobility to allow discharge to the venue listed below.  Pt only able to ambulate within room.  Pt significantly limited by pain however also dizziness/spinning which did not resolve, so pt assisted safely to recliner.  BP 88/52 mmHg, HR 75 bpm, SPO2 100% on room air upon pt sitting in recliner.  RN notified of BP.  Pt plans to eventually d/c home and states spouse will be home from work for 2 weeks to assist her if needed.        Recommendations for follow up therapy are one component of a multi-disciplinary discharge planning process, led by the attending physician.  Recommendations may be updated based on patient status, additional functional criteria and insurance authorization.  Follow Up Recommendations Follow physician's recommendations for discharge plan and follow up therapies      Assistance Recommended at Discharge Intermittent Supervision/Assistance  Patient can return home with the following  A little help with walking and/or transfers;A little help with bathing/dressing/bathroom    Equipment Recommendations Rolling walker (2 wheels)  Recommendations for Other Services       Functional Status Assessment Patient has had a recent decline in their functional status and demonstrates the ability to make significant improvements in function in a reasonable and predictable amount of time.     Precautions / Restrictions Precautions Precautions: Fall Restrictions Weight Bearing Restrictions: No RLE Weight Bearing: Weight bearing as tolerated      Mobility   Bed Mobility Overal bed mobility: Needs Assistance Bed Mobility: Supine to Sit     Supine to sit: Min assist, HOB elevated     General bed mobility comments: use of elevated HOB and Rail to self assist, provided assist/support of R LE due to pt's pain    Transfers Overall transfer level: Needs assistance Equipment used: Rolling walker (2 wheels) Transfers: Sit to/from Stand Sit to Stand: Min assist, From elevated surface           General transfer comment: verbal cues for UE and LE positioning, light assist to rise    Ambulation/Gait Ambulation/Gait assistance: Min guard Gait Distance (Feet): 14 Feet Assistive device: Rolling walker (2 wheels) Gait Pattern/deviations: Step-to pattern, Decreased stance time - right, Antalgic, Trunk flexed Gait velocity: decr     General Gait Details: verbal cues for sequence, RW positioning, posture; pt with increased dizziness and pain so limited distance; BP 88/51 mmHg upon returning to [employer]    Modified Rankin (Stroke Patients Only)       Balance                                             Pertinent Vitals/Pain Pain Assessment Pain Assessment: 0-10 Pain Score: 8  Pain Location: right hip Pain Descriptors / Indicators: Burning Pain Intervention(s): Monitored during session, Repositioned    Home Living Family/patient expects to be discharged to:: Private residence Living Arrangements: Spouse/significant  other;Children Available Help at Discharge: Family Type of Home: House Home Access: Stairs to enter Entrance Stairs-Rails: None Secretary/administrator of Steps: 1   Home Layout: One level Home Equipment: None      Prior Function Prior Level of Function : Independent/Modified Independent                     Hand Dominance        Extremity/Trunk Assessment        Lower Extremity Assessment Lower Extremity Assessment: RLE  deficits/detail RLE Deficits / Details: able to perform ankle pumps, anticipated post op hip weakness, pt self assisting R LE due to pain       Communication   Communication: No difficulties  Cognition Arousal/Alertness: Awake/alert Behavior During Therapy: WFL for tasks assessed/performed Overall Cognitive Status: Within Functional Limits for tasks assessed                                          General Comments      Exercises     Assessment/Plan    PT Assessment Patient needs continued PT services  PT Problem List Decreased strength;Decreased range of motion;Decreased balance;Decreased mobility;Decreased knowledge of precautions;Decreased knowledge of use of DME;Obesity;Pain;Decreased activity tolerance       PT Treatment Interventions Stair training;DME instruction;Therapeutic exercise;Balance training;Gait training;Functional mobility training;Therapeutic activities;Patient/family education    PT Goals (Current goals can be found in the Care Plan section)  Acute Rehab PT Goals PT Goal Formulation: With patient Time For Goal Achievement: 06/17/22 Potential to Achieve Goals: Good    Frequency 7X/week     Co-evaluation               AM-PAC PT "6 Clicks" Mobility  Outcome Measure Help needed turning from your back to your side while in a flat bed without using bedrails?: A Little Help needed moving from lying on your back to sitting on the side of a flat bed without using bedrails?: A Little Help needed moving to and from a bed to a chair (including a wheelchair)?: A Little Help needed standing up from a chair using your arms (e.g., wheelchair or bedside chair)?: A Little Help needed to walk in hospital room?: A Little Help needed climbing 3-5 steps with a railing? : A Lot 6 Click Score: 17    End of Session Equipment Utilized During Treatment: Gait belt Activity Tolerance: Treatment limited secondary to medical complications (Comment)  (drop in BP) Patient left: in chair;with call bell/phone within reach (pt agreeable to not mobilize without assist for safety) Nurse Communication: Mobility status PT Visit Diagnosis: Other abnormalities of gait and mobility (R26.89);Pain Pain - Right/Left: Right Pain - part of body: Hip    Time: 1610-9604 PT Time Calculation (min) (ACUTE ONLY): 24 min   Charges:   PT Evaluation $PT Eval Low Complexity: 1 Low PT Treatments $Gait Training: 8-22 mins       Thomasene Mohair PT, DPT Physical Therapist Acute Rehabilitation Services Preferred contact method: Secure Chat Weekend Pager Only: [phone] Office: [phone]   Audrey Bradley 06/13/2022, 11:31 AM

## 2022-06-13 NOTE — TOC Transition Note (Signed)
Transition of Care [facility]) - CM/SW Discharge Note   Patient Details  Name: Audrey Bradley MRN: [id number] Date of Birth: August 19, 1966  [facility] CM/SW Contact:  Lennart Pall, LCSW Phone Number: 06/13/2022, 9:59 AM   Clinical Narrative:    Met with pt and confirming she will receive RW via [employer] to room.  Plan for HEP.  No further TOC needs.   Final next level of care: Home/Self Care Barriers to Discharge: No Barriers Identified   Patient Goals and CMS Choice Patient states their goals for this hospitalization and ongoing recovery are:: return home      Discharge Placement                       Discharge Plan and Services                DME Arranged: Walker rolling DME Agency: [facility]                  Social Determinants of Health (SDOH) Interventions     Readmission Risk Interventions     No data to display

## 2022-06-13 NOTE — Progress Notes (Signed)
   Subjective: 1 Day Post-Op Procedure(s) (LRB): TOTAL HIP ARTHROPLASTY ANTERIOR APPROACH (Right) Patient reports pain as moderate.   Patient seen in rounds by Dr. Lequita Halt. Patient is well, and has had no acute complaints or problems other than pain in the right hip. Denies chest pain or SOB. Foley catheter to be removed this AM. We will begin therapy today.   Objective: Vital signs in last 24 hours: Temp:  [97.6 F (36.4 C)-98.8 F (37.1 C)] 98.8 F (37.1 C) (08/22 0504) Pulse Rate:  [57-84] 65 (08/22 0504) Resp:  [12-24] 18 (08/22 0504) BP: (94-155)/(52-92) 120/62 (08/22 0504) SpO2:  [97 %-100 %] 100 % (08/22 0504) Weight:  [110.7 kg] 110.7 kg (08/21 1149)  Intake/Output from previous day:  Intake/Output Summary (Last 24 hours) at 06/13/2022 0712 Last data filed at 06/13/2022 0504 Gross per 24 hour  Intake 2835.75 ml  Output 2400 ml  Net 435.75 ml     Intake/Output this shift: No intake/output data recorded.  Labs: Recent Labs    06/13/22 0328  HGB 11.1*   Recent Labs    06/13/22 0328  WBC 16.3*  RBC 3.68*  HCT 34.0*  PLT 328   Recent Labs    06/13/22 0328  NA 139  K 4.1  CL 109  CO2 23  BUN 12  CREATININE 0.72  GLUCOSE 138*  CALCIUM 8.3*   No results for input(s): "LABPT", "INR" in the last 72 hours.  Exam: General - Patient is Alert and Oriented Extremity - Neurologically intact Neurovascular intact Sensation intact distally Dorsiflexion/Plantar flexion intact Dressing - dressing C/D/I Motor Function - intact, moving foot and toes well on exam.   Past Medical History:  Diagnosis Date   Anxiety    Asthma    Seasonal   Hypothyroidism    Pneumonia    PONV (postoperative nausea and vomiting)     Assessment/Plan: 1 Day Post-Op Procedure(s) (LRB): TOTAL HIP ARTHROPLASTY ANTERIOR APPROACH (Right) Principal Problem:   Bilateral primary osteoarthritis of hip Active Problems:   Osteoarthritis of right hip  Estimated body mass index is  43.22 kg/m as calculated from the following:   Height as of this encounter: 5\' 3"  (1.6 m).   Weight as of this encounter: 110.7 kg. Advance diet Up with therapy D/C IV fluids  DVT Prophylaxis - Aspirin Weight bearing as tolerated. Begin therapy.  Plan is to go Home after hospital stay. Plan for discharge tomorrow pending progression with therapy. Mobility will be slow due to deconditioning.   , PA-C Orthopedic Surgery [phone] 06/13/2022, 7:12 AM

## 2022-06-13 NOTE — Progress Notes (Signed)
Physical Therapy Treatment Patient Details Name: Audrey Bradley MRN: [id number] DOB: 07/18/1966 Today's Date: 06/13/2022   History of Present Illness Pt is a 56 year old female s/p Right THA.  PMHx: anxiety, asthma, pneumonia, lapband surgery    PT Comments    Pt ambulated in just into hallway however distance limited by pain and fatigue.  Pt denies dizziness this afternoon however.  Pt assisted back to bed.  Pt did not yet feel able to perform exercises or practice steps.   Recommendations for follow up therapy are one component of a multi-disciplinary discharge planning process, led by the attending physician.  Recommendations may be updated based on patient status, additional functional criteria and insurance authorization.  Follow Up Recommendations  Follow physician's recommendations for discharge plan and follow up therapies     Assistance Recommended at Discharge Intermittent Supervision/Assistance  Patient can return home with the following A little help with walking and/or transfers;A little help with bathing/dressing/bathroom   Equipment Recommendations  Rolling walker (2 wheels)    Recommendations for Other Services       Precautions / Restrictions Precautions Precautions: Fall Restrictions RLE Weight Bearing: Weight bearing as tolerated     Mobility  Bed Mobility Overal bed mobility: Needs Assistance Bed Mobility: Sit to Supine     Supine to sit: Min assist, HOB elevated Sit to supine: Min assist   General bed mobility comments: assist/support of R LE due to pt's pain    Transfers Overall transfer level: Needs assistance Equipment used: Rolling walker (2 wheels) Transfers: Sit to/from Stand Sit to Stand: Min guard           General transfer comment: verbal cues for UE and LE positioning    Ambulation/Gait Ambulation/Gait assistance: Min guard Gait Distance (Feet): 22 Feet Assistive device: Rolling walker (2 wheels) Gait Pattern/deviations:  Step-to pattern, Decreased stance time - right, Antalgic, Trunk flexed Gait velocity: decr     General Gait Details: verbal cues for sequence, RW positioning, posture; pt denies dizziness this afternoon however still limited by pain and fatigue; 3 standing rest breaks required   Stairs             Wheelchair Mobility    Modified Rankin (Stroke Patients Only)       Balance                                            Cognition Arousal/Alertness: Awake/alert Behavior During Therapy: WFL for tasks assessed/performed Overall Cognitive Status: Within Functional Limits for tasks assessed                                          Exercises      General Comments        Pertinent Vitals/Pain Pain Assessment Pain Assessment: 0-10 Pain Score: 5  Pain Location: right hip Pain Descriptors / Indicators: Burning, Sore Pain Intervention(s): Repositioned, Monitored during session    Home Living                          Prior Function            PT Goals (current goals can now be found in the care plan section) Progress towards PT goals: Progressing toward  goals    Frequency    7X/week      PT Plan Current plan remains appropriate    Co-evaluation              AM-PAC PT "6 Clicks" Mobility   Outcome Measure  Help needed turning from your back to your side while in a flat bed without using bedrails?: A Little Help needed moving from lying on your back to sitting on the side of a flat bed without using bedrails?: A Little Help needed moving to and from a bed to a chair (including a wheelchair)?: A Little Help needed standing up from a chair using your arms (e.g., wheelchair or bedside chair)?: A Little Help needed to walk in hospital room?: A Little Help needed climbing 3-5 steps with a railing? : A Lot 6 Click Score: 17    End of Session Equipment Utilized During Treatment: Gait belt Activity Tolerance:  Patient tolerated treatment well Patient left: in bed;with call bell/phone within reach   PT Visit Diagnosis: Other abnormalities of gait and mobility (R26.89);Pain Pain - Right/Left: Right Pain - part of body: Hip     Time: 1425-1447 PT Time Calculation (min) (ACUTE ONLY): 22 min  Charges:  $Gait Training: 8-22 mins                    Paulino Door, DPT Physical Therapist Acute Rehabilitation Services Preferred contact method: Secure Chat Weekend Pager Only: [phone] Office: [phone]    [name] 06/13/2022, 3:33 PM

## 2022-06-14 ENCOUNTER — Encounter (HOSPITAL_COMMUNITY): Payer: Self-pay | Admitting: Orthopedic Surgery

## 2022-06-14 DIAGNOSIS — M1611 Unilateral primary osteoarthritis, right hip: Secondary | ICD-10-CM | POA: Diagnosis not present

## 2022-06-14 LAB — CBC
HCT: 30.4 % — ABNORMAL LOW (ref 36.0–46.0)
Hemoglobin: 9.8 g/dL — ABNORMAL LOW (ref 12.0–15.0)
MCH: 30.2 pg (ref 26.0–34.0)
MCHC: 32.2 g/dL (ref 30.0–36.0)
MCV: 93.8 fL (ref 80.0–100.0)
Platelets: 283 10*3/uL (ref 150–400)
RBC: 3.24 MIL/uL — ABNORMAL LOW (ref 3.87–5.11)
RDW: 12.8 % (ref 11.5–15.5)
WBC: 12.8 10*3/uL — ABNORMAL HIGH (ref 4.0–10.5)
nRBC: 0 % (ref 0.0–0.2)

## 2022-06-14 MED ORDER — ASPIRIN 325 MG PO TBEC: 325.0000 mg | DELAYED_RELEASE_TABLET | Freq: Two times a day (BID) | ORAL | 0 refills | Status: AC

## 2022-06-14 MED ORDER — CYCLOBENZAPRINE HCL 10 MG PO TABS: 10.0000 mg | ORAL_TABLET | Freq: Three times a day (TID) | ORAL | 0 refills | Status: AC | PRN

## 2022-06-14 MED ORDER — TRAMADOL HCL 50 MG PO TABS
50.0000 mg | ORAL_TABLET | Freq: Four times a day (QID) | ORAL | 0 refills | Status: AC | PRN
Start: 2022-06-14 — End: ?

## 2022-06-14 MED ORDER — OXYCODONE HCL 5 MG PO TABS
5.0000 mg | ORAL_TABLET | Freq: Four times a day (QID) | ORAL | 0 refills | Status: AC | PRN
Start: 2022-06-14 — End: ?

## 2022-06-14 NOTE — Progress Notes (Signed)
Physical Therapy Treatment Patient Details Name: Audrey Bradley MRN: [id number] DOB: 26-Apr-1966 Today's Date: 06/14/2022   History of Present Illness Pt is a 56 year old female s/p Right THA.  PMHx: anxiety, asthma, pneumonia, lapband surgery    PT Comments    Pt ambulated in hallway and performed LE exercises.  Pt limited with mobility prior to surgery due to pain however feels she will be able to mobilize around her home.  Pt reports feeling better today and anticipates d/c home when her ride is available later.  Will return to practice step prior to d/c.      Recommendations for follow up therapy are one component of a multi-disciplinary discharge planning process, led by the attending physician.  Recommendations may be updated based on patient status, additional functional criteria and insurance authorization.  Follow Up Recommendations  Follow physician's recommendations for discharge plan and follow up therapies     Assistance Recommended at Discharge Intermittent Supervision/Assistance  Patient can return home with the following A little help with walking and/or transfers;A little help with bathing/dressing/bathroom   Equipment Recommendations  Rolling walker (2 wheels)    Recommendations for Other Services       Precautions / Restrictions Precautions Precautions: Fall Restrictions RLE Weight Bearing: Weight bearing as tolerated     Mobility  Bed Mobility Overal bed mobility: Needs Assistance Bed Mobility: Supine to Sit, Sit to Supine     Supine to sit: HOB elevated, Min assist Sit to supine: Min assist   General bed mobility comments: assist/support of R LE due to pt's pain, pt plans to have spouse assist her at home    Transfers Overall transfer level: Needs assistance Equipment used: Rolling walker (2 wheels) Transfers: Sit to/from Stand Sit to Stand: Min guard           General transfer comment: verbal cues for UE and LE positioning     Ambulation/Gait Ambulation/Gait assistance: Min guard Gait Distance (Feet): 40 Feet Assistive device: Rolling walker (2 wheels) Gait Pattern/deviations: Step-to pattern, Decreased stance time - right, Antalgic, Trunk flexed Gait velocity: decr     General Gait Details: verbal cues for sequence, RW positioning, posture; one seated rest break and one standing rest break   Stairs             Wheelchair Mobility    Modified Rankin (Stroke Patients Only)       Balance                                            Cognition Arousal/Alertness: Awake/alert Behavior During Therapy: WFL for tasks assessed/performed Overall Cognitive Status: Within Functional Limits for tasks assessed                                          Exercises Total Joint Exercises Ankle Circles/Pumps: AROM, Both, 10 reps Quad Sets: AROM, Both, 10 reps Heel Slides: AAROM, Right, 10 reps Hip ABduction/ADduction: AAROM, Right, 10 reps Long Arc Quad: AROM, Right, Seated, 5 reps    General Comments        Pertinent Vitals/Pain Pain Assessment Pain Assessment: 0-10 Pain Score: 5  Pain Location: right hip Pain Descriptors / Indicators: Burning, Sore Pain Intervention(s): Monitored during session, Repositioned    Home Living  Prior Function            PT Goals (current goals can now be found in the care plan section) Progress towards PT goals: Progressing toward goals    Frequency    7X/week      PT Plan Current plan remains appropriate    Co-evaluation              AM-PAC PT "6 Clicks" Mobility   Outcome Measure  Help needed turning from your back to your side while in a flat bed without using bedrails?: A Little Help needed moving from lying on your back to sitting on the side of a flat bed without using bedrails?: A Little Help needed moving to and from a bed to a chair (including a wheelchair)?: A  Little Help needed standing up from a chair using your arms (e.g., wheelchair or bedside chair)?: A Little Help needed to walk in hospital room?: A Little Help needed climbing 3-5 steps with a railing? : A Lot 6 Click Score: 17    End of Session Equipment Utilized During Treatment: Gait belt Activity Tolerance: Patient tolerated treatment well Patient left: in bed;with call bell/phone within reach Nurse Communication: Mobility status PT Visit Diagnosis: Other abnormalities of gait and mobility (R26.89);Pain Pain - Right/Left: Right Pain - part of body: Hip     Time: 2297-9892 PT Time Calculation (min) (ACUTE ONLY): 37 min  Charges:  $Gait Training: 8-22 mins $Therapeutic Exercise: 8-22 mins                     Thomasene Mohair PT, DPT Physical Therapist Acute Rehabilitation Services Preferred contact method: Secure Chat Weekend Pager Only: [phone] Office: [phone]    [name] 06/14/2022, 12:50 PM

## 2022-06-14 NOTE — Progress Notes (Signed)
Physical Therapy Treatment Patient Details Name: Audrey Bradley MRN: [id number] DOB: April 09, 1966 Today's Date: 06/14/2022   History of Present Illness Pt is a 56 year old female s/p Right THA.  PMHx: anxiety, asthma, pneumonia, lapband surgery    PT Comments    Pt ambulated into hallway to practice one step.  Pt reports understanding and will have spouse assist at home.  Pt feels ready for d/c home today.   Recommendations for follow up therapy are one component of a multi-disciplinary discharge planning process, led by the attending physician.  Recommendations may be updated based on patient status, additional functional criteria and insurance authorization.  Follow Up Recommendations  Follow physician's recommendations for discharge plan and follow up therapies     Assistance Recommended at Discharge Intermittent Supervision/Assistance  Patient can return home with the following A little help with walking and/or transfers;A little help with bathing/dressing/bathroom;Help with stairs or ramp for entrance   Equipment Recommendations  Rolling walker (2 wheels)    Recommendations for Other Services       Precautions / Restrictions Precautions Precautions: Fall Restrictions Weight Bearing Restrictions: No RLE Weight Bearing: Weight bearing as tolerated     Mobility  Bed Mobility Overal bed mobility: Needs Assistance Bed Mobility: Sit to Supine     Supine to sit: HOB elevated, Min assist Sit to supine: Min assist   General bed mobility comments: pt sitting EOB on arrival (had finished lunch) assist/support of R LE due to pt's pain, pt plans to have spouse assist her at home    Transfers Overall transfer level: Needs assistance Equipment used: Rolling walker (2 wheels) Transfers: Sit to/from Stand Sit to Stand: Min guard           General transfer comment: verbal cues for UE and LE positioning    Ambulation/Gait Ambulation/Gait assistance: Min guard Gait  Distance (Feet): 22 Feet Assistive device: Rolling walker (2 wheels) Gait Pattern/deviations: Step-to pattern, Decreased stance time - right, Antalgic, Trunk flexed Gait velocity: decr     General Gait Details: verbal cues for sequence, RW positioning, posture; multiple short standing rest breaks for pain - pt leans forearms on RW   Stairs Stairs: Yes Stairs assistance: Min guard Stair Management: Forwards, Step to pattern, With walker Number of Stairs: 1 General stair comments: verbal cues for safety and technique, pt reports understanding stating she has been performing this way since prior to surgery   Wheelchair Mobility    Modified Rankin (Stroke Patients Only)       Balance                                            Cognition Arousal/Alertness: Awake/alert Behavior During Therapy: WFL for tasks assessed/performed Overall Cognitive Status: Within Functional Limits for tasks assessed                                          Exercises Total Joint Exercises Ankle Circles/Pumps: AROM, Both, 10 reps Quad Sets: AROM, Both, 10 reps Heel Slides: AAROM, Right, 10 reps Hip ABduction/ADduction: AAROM, Right, 10 reps Long Arc Quad: AROM, Right, Seated, 5 reps    General Comments        Pertinent Vitals/Pain Pain Assessment Pain Assessment: 0-10 Pain Score: 5  Pain Location: right  hip Pain Descriptors / Indicators: Burning, Sore Pain Intervention(s): Repositioned, Monitored during session, Ice applied    Home Living                          Prior Function            PT Goals (current goals can now be found in the care plan section) Progress towards PT goals: Progressing toward goals    Frequency    7X/week      PT Plan Current plan remains appropriate    Co-evaluation              AM-PAC PT "6 Clicks" Mobility   Outcome Measure  Help needed turning from your back to your side while in a flat bed  without using bedrails?: A Little Help needed moving from lying on your back to sitting on the side of a flat bed without using bedrails?: A Little Help needed moving to and from a bed to a chair (including a wheelchair)?: A Little Help needed standing up from a chair using your arms (e.g., wheelchair or bedside chair)?: A Little Help needed to walk in hospital room?: A Little Help needed climbing 3-5 steps with a railing? : A Little 6 Click Score: 18    End of Session Equipment Utilized During Treatment: Gait belt Activity Tolerance: Patient tolerated treatment well Patient left: in bed;with call bell/phone within reach Nurse Communication: Mobility status PT Visit Diagnosis: Other abnormalities of gait and mobility (R26.89);Pain Pain - Right/Left: Right Pain - part of body: Hip     Time: 4742-5956 PT Time Calculation (min) (ACUTE ONLY): 18 min  Charges:  $Gait Training: 8-22 mins                    Audrey Bradley PT, DPT Physical Therapist Acute Rehabilitation Services Preferred contact method: Secure Chat Weekend Pager Only: [phone] Office: [phone]     Audrey Bradley 06/14/2022, 3:16 PM

## 2022-06-14 NOTE — Plan of Care (Signed)
  Problem: Education: Goal: Knowledge of the prescribed therapeutic regimen will improve Outcome: Progressing   Problem: Activity: Goal: Ability to tolerate increased activity will improve Outcome: Progressing   Problem: Pain Management: Goal: Pain level will decrease with appropriate interventions Outcome: Progressing   Problem: Clinical Measurements: Goal: Ability to maintain clinical measurements within normal limits will improve Outcome: Progressing   Problem: Safety: Goal: Ability to remain free from injury will improve Outcome: Progressing

## 2022-06-14 NOTE — Progress Notes (Signed)
Provided discharge education/instructions, all questions and concerns addressed. Pt not in acute distress, discharged home with belongings accompanied by husband. 

## 2022-06-14 NOTE — Progress Notes (Signed)
   Subjective: 2 Days Post-Op Procedure(s) (LRB): TOTAL HIP ARTHROPLASTY ANTERIOR APPROACH (Right) Patient seen in rounds by Dr. Lequita Halt. Patient is well, and has had no acute complaints or problems. Patient reports pain as mild. Worked with physical therapy yesterday and ambulated 22'.  Objective: Vital signs in last 24 hours: Temp:  [98 F (36.7 C)-99.8 F (37.7 C)] 99 F (37.2 C) (08/23 0548) Pulse Rate:  [75-91] 91 (08/23 0548) Resp:  [16-20] 16 (08/23 0548) BP: (117-136)/(59-74) 117/63 (08/23 0548) SpO2:  [95 %-100 %] 99 % (08/23 0548)  Intake/Output from previous day:  Intake/Output Summary (Last 24 hours) at 06/14/2022 0729 Last data filed at 06/14/2022 0600 Gross per 24 hour  Intake 1320 ml  Output 1200 ml  Net 120 ml    Intake/Output this shift: No intake/output data recorded.  Labs: Recent Labs    06/13/22 0328 06/14/22 0301  HGB 11.1* 9.8*   Recent Labs    06/13/22 0328 06/14/22 0301  WBC 16.3* 12.8*  RBC 3.68* 3.24*  HCT 34.0* 30.4*  PLT 328 283   Recent Labs    06/13/22 0328  NA 139  K 4.1  CL 109  CO2 23  BUN 12  CREATININE 0.72  GLUCOSE 138*  CALCIUM 8.3*   No results for input(s): "LABPT", "INR" in the last 72 hours.  Exam: General - Patient is Alert and Oriented Extremity - Neurologically intact Neurovascular intact Sensation intact distally Dorsiflexion/Plantar flexion intact Dressing/Incision - clean, dry, no drainage Motor Function - intact, moving foot and toes well on exam.  Past Medical History:  Diagnosis Date   Anxiety    Asthma    Seasonal   Hypothyroidism    Pneumonia    PONV (postoperative nausea and vomiting)     Assessment/Plan: 2 Days Post-Op Procedure(s) (LRB): TOTAL HIP ARTHROPLASTY ANTERIOR APPROACH (Right) Principal Problem:   Bilateral primary osteoarthritis of hip Active Problems:   Osteoarthritis of right hip  Estimated body mass index is 43.22 kg/m as calculated from the following:   Height  as of this encounter: 5\' 3"  (1.6 m).   Weight as of this encounter: 110.7 kg.  DVT Prophylaxis - Aspirin Weight-bearing as tolerated.  Continue with physical therapy. Expected discharge home today pending progress with physical therapy. She will do a HEP once discharged. Follow-up in clinic in 2 weeks.  The PDMP database was reviewed today prior to any opioid medications being prescribed to this patient.  R. , PA-C Orthopedic Surgery [phone] 06/14/2022, 7:29 AM

## 2022-06-14 NOTE — Plan of Care (Signed)

## 2022-06-15 NOTE — Discharge Summary (Signed)
Physician Discharge Summary   Patient ID: Audrey Bradley MRN: [id number] DOB/AGE: 24-Feb-1966 56 y.o.  Admit date: 06/12/2022 Discharge date: 06/14/2022  Primary Diagnosis: Osteoarthritis of the right hip   Admission Diagnoses:  Past Medical History:  Diagnosis Date   Anxiety    Asthma    Seasonal   Hypothyroidism    Pneumonia    PONV (postoperative nausea and vomiting)    Discharge Diagnoses:   Principal Problem:   Bilateral primary osteoarthritis of hip Active Problems:   Osteoarthritis of right hip  Estimated body mass index is 43.22 kg/m as calculated from the following:   Height as of this encounter: 5\' 3"  (1.6 m).   Weight as of this encounter: 110.7 kg.  Procedure:  Procedure(s) (LRB): TOTAL HIP ARTHROPLASTY ANTERIOR APPROACH (Right)   Consults: None  HPI: Audrey Bradley is a 56 y.o. female who has advanced end-stage arthritis of their Right  hip with progressively worsening pain and dysfunction.The patient has failed nonoperative management and presents for  total hip arthroplasty.   Laboratory Data: Admission on 06/12/2022, Discharged on 06/14/2022  Component Date Value Ref Range Status   ABO/RH(D) 06/12/2022    Final                   Value:A POS Performed at [facility], [address] [place], [place] [address]    WBC 06/13/2022 16.3 (H)  4.0 - 10.5 K/uL Final   RBC 06/13/2022 3.68 (L)  3.87 - 5.11 MIL/uL Final   Hemoglobin 06/13/2022 11.1 (L)  12.0 - 15.0 g/dL Final   HCT 29/56/213008/22/2023 34.0 (L)  36.0 - 46.0 % Final   MCV 06/13/2022 92.4  80.0 - 100.0 fL Final   MCH 06/13/2022 30.2  26.0 - 34.0 pg Final   MCHC 06/13/2022 32.6  30.0 - 36.0 g/dL Final   RDW 86/57/846908/22/2023 12.3  11.5 - 15.5 % Final   Platelets 06/13/2022 328  150 - 400 K/uL Final   nRBC 06/13/2022 0.0  0.0 - 0.2 % Final   Performed at [facility], [address] [place], [place] [address]   Sodium 06/13/2022 139  135 - 145 mmol/L Final    Potassium 06/13/2022 4.1  3.5 - 5.1 mmol/L Final   Chloride 06/13/2022 109  98 - 111 mmol/L Final   CO2 06/13/2022 23  22 - 32 mmol/L Final   Glucose, Bld 06/13/2022 138 (H)  70 - 99 mg/dL Final   Glucose reference range applies only to samples taken after fasting for at least 8 hours.   BUN 06/13/2022 12  6 - 20 mg/dL Final   Creatinine, Ser 06/13/2022 0.72  0.44 - 1.00 mg/dL Final   Calcium 84/13/244008/22/2023 8.3 (L)  8.9 - 10.3 mg/dL Final   GFR, Estimated 06/13/2022 >60  >60 mL/min Final   Comment: (NOTE) Calculated using the CKD-EPI Creatinine Equation (2021)    Anion gap 06/13/2022 7  5 - 15 Final   Performed at [facility], [address] [place], [place] [address]   WBC 06/14/2022 12.8 (H)  4.0 - 10.5 K/uL Final   RBC 06/14/2022 3.24 (L)  3.87 - 5.11 MIL/uL Final   Hemoglobin 06/14/2022 9.8 (L)  12.0 - 15.0 g/dL Final   HCT 53/66/440308/23/2023 30.4 (L)  36.0 - 46.0 % Final   MCV 06/14/2022 93.8  80.0 - 100.0 fL Final   MCH 06/14/2022 30.2  26.0 - 34.0 pg Final   MCHC 06/14/2022  32.2  30.0 - 36.0 g/dL Final   RDW 42/70/6237 12.8  11.5 - 15.5 % Final   Platelets 06/14/2022 283  150 - 400 K/uL Final   nRBC 06/14/2022 0.0  0.0 - 0.2 % Final   Performed at [facility], [address], [place], [place] [address]  Hospital Outpatient Visit on 05/30/2022  Component Date Value Ref Range Status   MRSA, PCR 05/30/2022 NEGATIVE  NEGATIVE Final   Staphylococcus aureus 05/30/2022 POSITIVE (A)  NEGATIVE Final   Comment: (NOTE) The Xpert SA Assay (FDA approved for NASAL specimens in patients 76 years of age and older), is one component of a comprehensive surveillance program. It is not intended to diagnose infection nor to guide or monitor treatment. Performed at [facility], [address], [place], [place] [address]    Sodium 05/30/2022 142  135 - 145 mmol/L Final   Potassium 05/30/2022 4.0  3.5 - 5.1 mmol/L Final   Chloride 05/30/2022 111   98 - 111 mmol/L Final   CO2 05/30/2022 25  22 - 32 mmol/L Final   Glucose, Bld 05/30/2022 82  70 - 99 mg/dL Final   Glucose reference range applies only to samples taken after fasting for at least 8 hours.   BUN 05/30/2022 26 (H)  6 - 20 mg/dL Final   Creatinine, Ser 05/30/2022 0.90  0.44 - 1.00 mg/dL Final   Calcium 60/73/7106 9.0  8.9 - 10.3 mg/dL Final   GFR, Estimated 05/30/2022 >60  >60 mL/min Final   Comment: (NOTE) Calculated using the CKD-EPI Creatinine Equation (2021)    Anion gap 05/30/2022 6  5 - 15 Final   Performed at [facility], [address] [place], [place] [address]   WBC 05/30/2022 8.4  4.0 - 10.5 K/uL Final   RBC 05/30/2022 4.50  3.87 - 5.11 MIL/uL Final   Hemoglobin 05/30/2022 13.4  12.0 - 15.0 g/dL Final   HCT 54/62/7035 41.7  36.0 - 46.0 % Final   MCV 05/30/2022 92.7  80.0 - 100.0 fL Final   MCH 05/30/2022 29.8  26.0 - 34.0 pg Final   MCHC 05/30/2022 32.1  30.0 - 36.0 g/dL Final   RDW 00/93/8182 12.5  11.5 - 15.5 % Final   Platelets 05/30/2022 365  150 - 400 K/uL Final   nRBC 05/30/2022 0.0  0.0 - 0.2 % Final   Performed at [facility], [address] [place], [place] [address]   ABO/RH(D) 05/30/2022 A POS   Final   Antibody Screen 05/30/2022 NEG   Final   Sample Expiration 05/30/2022 06/13/2022,2359   Final   Extend sample reason 05/30/2022    Final                   Value:NO TRANSFUSIONS OR PREGNANCY IN THE PAST 3 MONTHS Performed at [facility], [address], [place], [place] [address]      X-Rays:DG Pelvis Portable  Result Date: 06/12/2022 CLINICAL DATA:  Right hip surgery EXAM: PORTABLE PELVIS 1-2 VIEWS COMPARISON:  04/20/2021 FINDINGS: Interval postsurgical changes from right total hip arthroplasty. Arthroplasty components are in their expected alignment. No periprosthetic fracture or evidence of other complication. Expected postoperative changes within the overlying soft tissues. Advanced  degenerative changes of the left hip, similar to prior. IMPRESSION: Interval postsurgical changes from right total hip arthroplasty. Electronically Signed   By: Duanne Guess D.O.   On: 06/12/2022 16:57   DG HIP PORT UNILAT  WITH PELVIS 1V RIGHT  Result Date: 06/12/2022 CLINICAL DATA:  Right hip surgery EXAM: DG HIP (WITH OR WITHOUT PELVIS) 1V PORT RIGHT COMPARISON:  09/30/2020 FINDINGS: Nine C-arm fluoroscopic images were obtained intraoperatively and submitted for post operative interpretation. Images demonstrate placement of right total hip arthroplasty hardware. 9 seconds fluoroscopy time utilized. Radiation dose: 2.26 mGy. Please see the performing provider's procedural report for further detail. IMPRESSION: Intraoperative fluoroscopy during right total hip arthroplasty. Electronically Signed   By: Duanne Guess D.O.   On: 06/12/2022 16:08   DG C-Arm 1-60 Min-No Report  Result Date: 06/12/2022 Fluoroscopy was utilized by the requesting physician.  No radiographic interpretation.    EKG:No orders found for this or any previous visit.   Hospital Course: Audrey Bradley is a 56 y.o. who was admitted to [facility]. They were brought to the operating room on 06/12/2022 and underwent Procedure(s): TOTAL HIP ARTHROPLASTY ANTERIOR APPROACH.  Patient tolerated the procedure well and was later transferred to the recovery room and then to the orthopaedic floor for postoperative care. They were given PO and IV analgesics for pain control following their surgery. They were given 24 hours of postoperative antibiotics of  Anti-infectives (From admission, onward)    Start     Dose/Rate Route Frequency Ordered Stop   06/12/22 2045  ceFAZolin (ANCEF) IVPB 2g/100 mL premix        2 g 200 mL/hr over 30 Minutes Intravenous Every 6 hours 06/12/22 1952 06/13/22 0219   06/12/22 1130  ceFAZolin (ANCEF) IVPB 2g/100 mL premix        2 g 200 mL/hr over 30 Minutes Intravenous On call to O.R. 06/12/22  1122 06/12/22 1423     and started on DVT prophylaxis in the form of Aspirin.   PT and OT were ordered for total joint protocol. Discharge planning consulted to help with postop disposition and equipment needs. Patient had a fair night on the evening of surgery. They started to get up OOB with therapy on POD #1. Continued to work with therapy into POD #2. Patient was seen during rounds on day two and was ready to go home pending progress with therapy. Patient worked with therapy for 3 additional sessions and was meeting their goals. Dressing was changed and the incision was C/D/I.  They were discharged home later that day in stable condition.  Diet: Regular diet Activity: WBAT Follow-up: in 2 weeks Disposition: Home Discharged Condition: stable   Discharge Instructions     Call MD / Call 911   Complete by: As directed    If you experience chest pain or shortness of breath, CALL 911 and be transported to the hospital emergency room.  If you develope a fever above 101 F, pus (white drainage) or increased drainage or redness at the wound, or calf pain, call your surgeon's office.   Change dressing   Complete by: As directed    You have an adhesive waterproof bandage over the incision. Leave this in place until your first follow-up appointment. Once you remove this you will not need to place another bandage.   Constipation Prevention   Complete by: As directed    Drink plenty of fluids.  Prune juice may be helpful.  You may use a stool softener, such as Colace (over the counter) 100 mg twice a day.  Use MiraLax (over the counter) for constipation as needed.   Diet - low sodium heart healthy   Complete by: As directed  Do not sit on low chairs, stoools or toilet seats, as it may be difficult to get up from low surfaces   Complete by: As directed    Driving restrictions   Complete by: As directed    No driving for two weeks   Post-operative opioid taper instructions:   Complete by: As  directed    POST-OPERATIVE OPIOID TAPER INSTRUCTIONS: It is important to wean off of your opioid medication as soon as possible. If you do not need pain medication after your surgery it is ok to stop day one. Opioids include: Codeine, Hydrocodone(Norco, Vicodin), Oxycodone(Percocet, oxycontin) and hydromorphone amongst others.  Long term and even short term use of opiods can cause: Increased pain response Dependence Constipation Depression Respiratory depression And more.  Withdrawal symptoms can include Flu like symptoms Nausea, vomiting And more Techniques to manage these symptoms Hydrate well Eat regular healthy meals Stay active Use relaxation techniques(deep breathing, meditating, yoga) Do Not substitute Alcohol to help with tapering If you have been on opioids for less than two weeks and do not have pain than it is ok to stop all together.  Plan to wean off of opioids This plan should start within one week post op of your joint replacement. Maintain the same interval or time between taking each dose and first decrease the dose.  Cut the total daily intake of opioids by one tablet each day Next start to increase the time between doses. The last dose that should be eliminated is the evening dose.      TED hose   Complete by: As directed    Use stockings (TED hose) for three weeks on both leg(s).  You may remove them at night for sleeping.   Weight bearing as tolerated   Complete by: As directed       Allergies as of 06/14/2022       Reactions   Augmentin [amoxicillin-pot Clavulanate] Nausea And Vomiting   Valium [diazepam] Other (See Comments)   Makes psychotic   Erythromycin Palpitations   Predicort [prednisolone] Anxiety   Heart race        Medication List     STOP taking these medications    diclofenac Sodium 1 % Gel Commonly known as: VOLTAREN   HYDROcodone-acetaminophen 7.5-325 MG tablet Commonly known as: NORCO       TAKE these medications     albuterol 108 (90 Base) MCG/ACT inhaler Commonly known as: VENTOLIN HFA Inhale 2 puffs into the lungs every 6 (six) hours as needed for wheezing or shortness of breath. Notes to patient: Resume home regimen   albuterol 1.25 MG/3ML nebulizer solution Commonly known as: ACCUNEB Take 1 ampule by nebulization every 6 (six) hours as needed for wheezing. Notes to patient: Resume home regimen   aspirin EC 325 MG tablet Take 1 tablet (325 mg total) by mouth 2 (two) times daily for 19 days. Then take an 81 mg aspirin once a day for 3 weeks. Then discontinue.   cyclobenzaprine 10 MG tablet Commonly known as: FLEXERIL Take 1 tablet (10 mg total) by mouth 3 (three) times daily as needed for muscle spasms. What changed: when to take this Notes to patient: Last dose given 08/23 08:31am   estradiol 0.1 MG/GM vaginal cream Commonly known as: ESTRACE Place 2 g vaginally 3 (three) times a week. Notes to patient: Resume home regimen   etodolac 500 MG tablet Commonly known as: LODINE Take 500 mg by mouth 2 (two) times daily. Notes to patient: Resume home  regimen   levothyroxine 50 MCG tablet Commonly known as: SYNTHROID Take 50 mcg by mouth at bedtime.   loratadine 10 MG tablet Commonly known as: CLARITIN Take 10 mg by mouth daily as needed for allergies. Notes to patient: Resume home regimen   mometasone 50 MCG/ACT nasal spray Commonly known as: NASONEX Place 2 sprays into the nose daily as needed (allergies). Notes to patient: Resume home regimen   oxyCODONE 5 MG immediate release tablet Commonly known as: Oxy IR/ROXICODONE Take 1-2 tablets (5-10 mg total) by mouth every 6 (six) hours as needed for severe pain. Notes to patient: Last dose given 08/23 05:50am   topiramate 50 MG tablet Commonly known as: TOPAMAX Take 50 mg by mouth at bedtime.   traMADol 50 MG tablet Commonly known as: ULTRAM Take 1 tablet (50 mg total) by mouth every 6 (six) hours as needed for moderate  pain. Notes to patient: Last dose given 08/23 05:50am   zolpidem 10 MG tablet Commonly known as: AMBIEN Take 10 mg by mouth at bedtime.               Discharge Care Instructions  (From admission, onward)           Start     Ordered   06/14/22 0000  Weight bearing as tolerated        06/14/22 0738   06/14/22 0000  Change dressing       Comments: You have an adhesive waterproof bandage over the incision. Leave this in place until your first follow-up appointment. Once you remove this you will not need to place another bandage.   06/14/22 5993            Follow-up Information     Ollen Gross, MD. Schedule an appointment as soon as possible for a visit in 2 week(s).   Specialty: Orthopedic Surgery Contact information: [address] [place] [address] [phone]                 Signed: R. Arcola Jansky, PA-C Orthopedic Surgery 06/15/2022, 7:58 AM

## 2022-06-25 ENCOUNTER — Ambulatory Visit: Payer: BC Managed Care – PPO

## 2022-08-24 ENCOUNTER — Ambulatory Visit: Payer: BC Managed Care – PPO | Attending: Student | Admitting: Physical Therapy

## 2022-08-24 ENCOUNTER — Other Ambulatory Visit: Payer: Self-pay

## 2022-08-24 ENCOUNTER — Encounter: Payer: Self-pay | Admitting: Physical Therapy

## 2022-08-24 DIAGNOSIS — M25551 Pain in right hip: Secondary | ICD-10-CM | POA: Insufficient documentation

## 2022-08-24 DIAGNOSIS — R293 Abnormal posture: Secondary | ICD-10-CM | POA: Insufficient documentation

## 2022-08-24 DIAGNOSIS — M6281 Muscle weakness (generalized): Secondary | ICD-10-CM | POA: Diagnosis not present

## 2022-08-24 NOTE — Therapy (Signed)
OUTPATIENT PHYSICAL THERAPY LOWER EXTREMITY EVALUATION   Patient Name: Audrey Bradley MRN: [id number] DOB:02/25/1966, 56 y.o., female Today's Date: 08/24/2022   PT End of Session - 08/24/22 0921     Visit Number 1    Date for PT Re-Evaluation 10/19/22    Authorization Type [employer]    PT Start Time 0924    PT Stop Time 1015    PT Time Calculation (min) 51 min    Activity Tolerance Patient tolerated treatment well    Behavior During Therapy WFL for tasks assessed/performed             Past Medical History:  Diagnosis Date   Anxiety    Asthma    Seasonal   Hypothyroidism    Pneumonia    PONV (postoperative nausea and vomiting)    Past Surgical History:  Procedure Laterality Date   ABDOMINAL HYSTERECTOMY     APPENDECTOMY     BARIATRIC SURGERY     lapband   CHOLECYSTECTOMY     FOOT SURGERY Left    tendon repair   MASS EXCISION     left underarm   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   Procedure: TOTAL HIP ARTHROPLASTY ANTERIOR APPROACH;  Surgeon: Gaynelle Arabian, MD;  Location: [facility];  Service: Orthopedics;  Laterality: Right;   Patient Active Problem List   Diagnosis Date Noted   Osteoarthritis of right hip 06/12/2022   Unilateral primary osteoarthritis, right hip 10/07/2020   Bilateral primary osteoarthritis of hip 09/30/2020   Morbid exogenous obesity ([place]) 09/30/2020   BMI 50.0-59.9, adult [facility]) 09/30/2020   Lapband Vanguard 11cm June 2007 [place], [place], [place]) 09/12/2013    PCP: Lanier Prude, PA  REFERRING PROVIDER: Jearld Lesch, PA  REFERRING DIAG: [phone] (ICD-10-CM) - Encounter for other specified aftercare  THERAPY DIAG:  Muscle weakness (generalized)  Abnormal posture  Pain in right hip  Rationale for Evaluation and Treatment: Rehabilitation  ONSET DATE: 06/12/22, THA  SUBJECTIVE:   SUBJECTIVE STATEMENT: Pt is approx 10 weeks s/p Rt THA (anterior approach) performed by Dr. Wynelle Link on 06/12/22.  Pt was using walker before surgery x  1.5 years due to Rt hip pain/OA.  Now using SPC.  Has not had PT since surgery but has been doing a packet of therex given post-operatively.   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   I am worried about trusting Rt LE.  My stability doesn't seem good.  I am numb in my Rt thigh. Nov 10 is next visit with surgeon.  I want to get in pool but my incision hasn't been cleared yet due to very small area that isn't fully closed/healed.  PERTINENT HISTORY: PMH: anxiety, asthma, hypothyroidism PAIN:  PAIN:  Are you having pain? Yes NPRS scale: 5/10 Pain location: Rt anterior and lateral thigh Pain orientation: Right  PAIN TYPE: sensation is waking up, crawling, burning, tingling, numb - no pain Pain description: intermittent  Aggravating factors: textures on Rt anterior thigh, at night in bed Relieving factors: walking, putting pressure on the thigh   PRECAUTIONS: None  WEIGHT BEARING RESTRICTIONS: No  FALLS:  Has patient fallen in last 6 months? 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  PLOF: Requires assistive device for independence (used walker before surgery, now using SPC)  PATIENT GOALS: be able to walk without a cane   OBJECTIVE:   DIAGNOSTIC FINDINGS:   PATIENT SURVEYS:  FOTO 52% goal 68%  COGNITION: Overall cognitive status: Within functional limits for tasks assessed     SENSATION: Limited sensation Rt lateral and anterior thigh  EDEMA:    MUSCLE LENGTH: 08/24/22: Hamstrings: Right 70 deg; Left 75 deg Thomas test: Right positive 10 deg  POSTURE: flexed trunk  and lacks full knee ext on Rt, lacks neutral Rt hip positioning (flexed) in  standing  PALPATION: Altered sensation along lateral and anterior Rt thigh  LOWER EXTREMITY ROM:  Active ROM Right eval Left eval  Hip flexion 100 100  Hip extension Lacks 10 deg 5  Hip abduction    Hip adduction    Hip internal rotation 15 15  Hip external rotation 45 45  Knee flexion  WFL  Knee extension Lacks 10 deg full  Ankle dorsiflexion    Ankle plantarflexion    Ankle inversion    Ankle eversion     (Blank rows = not tested)  LOWER EXTREMITY MMT:  MMT Right eval Left eval  Hip flexion 4-   Hip extension 4   Hip abduction 4   Hip adduction 5   Hip internal rotation 4+   Hip external rotation 3+   Knee flexion 4 4  Knee extension 4 4  Ankle dorsiflexion    Ankle plantarflexion    Ankle inversion    Ankle eversion     (Blank rows = not tested)  LOWER EXTREMITY SPECIAL TESTS:    FUNCTIONAL TESTS:  Eval 08/24/22: 5x sit to stand: 19 sec with UE use on chair TUG: 26" with SPC 3' walk test: 66' with 2-wheeled walker   GAIT: Eval 08/24/22: Distance walked: 303' (3' walk test) Assistive device utilized: Environmental consultant - 2 wheeled Level of assistance: Modified independence Comments: lacks terminal knee extension on Rt, flexed trunk over Rt hip, lacks Rt hip extension and gluteal activation   TODAY'S TREATMENT:                                                                                                                              DATE:  08/24/22: Encouraged Pt to perform 3 daily short walks with walker to work on gait endurance. Initiated HEP below for stretching.   PATIENT EDUCATION:  Education details: Access Code: [email or web address] Person educated: Patient Education method: Programmer, multimedia, Demonstration, Verbal cues, and Handouts Education comprehension: verbalized understanding and returned demonstration  HOME EXERCISE PROGRAM: Access Code: [email or web address] URL: [email or web address] Date: 08/24/2022 Prepared by: Loistine Simas Yurika Pereda  Exercises -  Gastroc Stretch on Wall  - 2 x daily - 7 x weekly - 1 sets - 3 reps - 20 hold - Seated Hamstring Stretch  - 2 x daily - 7 x weekly - 1 sets - 3 reps - 20 hold -  Hip Flexor Stretch with Chair  - 2 x daily - 7 x weekly - 2 sets - 10 reps  ASSESSMENT:  CLINICAL IMPRESSION: Patient is a 56 y.o. female who was seen today for physical therapy evaluation and treatment for rehab following Rt THA performed 06/12/22 .  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  She has no pain but does report altered sensation along lateral and anterior Rt thigh as nerves are "waking back up."  Chief concern is strength, stability and endurance.  PLOF was with walker x 1.5 years before surgery due to Rt hip pain.  Her goal is to be able to walk w/o AD.  She lacks full ext of Rt knee and hip.  She has limited strength, ROM, flexibility and gait pattern/endurance.  She will benefit from skilled PT to address findings and maximize outcome.  OBJECTIVE IMPAIRMENTS: Abnormal gait, decreased activity tolerance, difficulty walking, decreased ROM, decreased strength, impaired flexibility, impaired sensation, improper body mechanics, postural dysfunction, and pain.   ACTIVITY LIMITATIONS: lifting, bending, squatting, sleeping, stairs, and locomotion level  PARTICIPATION LIMITATIONS: meal prep, cleaning, laundry, shopping, community activity, occupation, and yard work  PERSONAL FACTORS: Time since onset of injury/illness/exacerbation and 1-2 comorbidities: bil knee OA, Lt hip OA  are also affecting patient's functional outcome.   REHAB POTENTIAL: Good  CLINICAL DECISION MAKING: Stable/uncomplicated  EVALUATION COMPLEXITY: Low   GOALS: Goals reviewed with patient? Yes  SHORT TERM GOALS: Target date: 09/21/2022  Pt will be ind with initial HEP without exacerbation of symptoms. Baseline: Goal status: INITIAL  2.  Improve 5x sit to stand to 14 sec or less Baseline: 19 sec Goal status:  INITIAL  3.  Improve TUG to 18 sec or less with SPC Baseline: 26 sec with SPC Goal status: INITIAL    LONG TERM GOALS: Target date: 10/19/2022   Pt will be ind with advanced HEP and understand how to safely progress Baseline:  Goal status: INITIAL  2.  Pt will improve FOTO score to at least 68% to demo improved function. Baseline: 52% Goal status: INITIAL  3.  Pt will be able to sleep on Rt side for up to 2 hours before needing to change positions Baseline: up to 1 hour now Goal status: INITIAL  4.  Pt will demo symmetrical gait pattern with full Rt hip and knee extension within 3' walk test with SPC vs walker covering at least 300' Baseline: 303' with walker Goal status: INITIAL  5.  Pt will achieve at least 4+/5 strength of Rt hip and thigh to allow for improved squatting, stairs and gait Baseline:  Goal status: INITIAL  6.  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 Baseline:  Goal status: INITIAL   PLAN:  PT FREQUENCY: 2x/week  PT DURATION: 8 weeks  PLANNED INTERVENTIONS: Therapeutic exercises, Therapeutic activity, Neuromuscular re-education, Balance training, Gait training, Patient/Family education, Self Care, Joint mobilization, Stair training, Aquatic Therapy, Dry Needling, Electrical stimulation, Spinal mobilization, Cryotherapy, Moist heat, Taping, Vasopneumatic device, Ionotophoresis 4mg /ml Dexamethasone, and Manual therapy  PLAN FOR NEXT SESSION: NuStep, review HEP, continue stretching hip flexor, gastroc and hamstring for Rt hip and knee extension, gait training and gait endurance, functional strength of Rt hip and knee  Prue Lingenfelter, PT 08/24/22 1:36 PM

## 2022-09-05 ENCOUNTER — Emergency Department (HOSPITAL_BASED_OUTPATIENT_CLINIC_OR_DEPARTMENT_OTHER)
Admission: EM | Admit: 2022-09-05 | Discharge: 2022-09-05 | Disposition: A | Discharge status: Home / Self Care | Payer: BC Managed Care – PPO | Attending: Emergency Medicine | Admitting: Emergency Medicine

## 2022-09-05 ENCOUNTER — Encounter (HOSPITAL_BASED_OUTPATIENT_CLINIC_OR_DEPARTMENT_OTHER): Payer: Self-pay | Admitting: Emergency Medicine

## 2022-09-05 DIAGNOSIS — L02415 Cutaneous abscess of right lower limb: Secondary | ICD-10-CM | POA: Diagnosis present

## 2022-09-05 DIAGNOSIS — L03115 Cellulitis of right lower limb: Secondary | ICD-10-CM | POA: Insufficient documentation

## 2022-09-05 LAB — BASIC METABOLIC PANEL
Anion gap: 7 (ref 5–15)
BUN: 16 mg/dL (ref 6–20)
CO2: 24 mmol/L (ref 22–32)
Calcium: 8.2 mg/dL — ABNORMAL LOW (ref 8.9–10.3)
Chloride: 110 mmol/L (ref 98–111)
Creatinine, Ser: 0.75 mg/dL (ref 0.44–1.00)
GFR, Estimated: >60 mL/min (ref >60)
Glucose, Bld: 98 mg/dL (ref 70–99)
Potassium: 3.5 mmol/L (ref 3.5–5.1)
Sodium: 141 mmol/L (ref 135–145)

## 2022-09-05 LAB — CBC WITH DIFFERENTIAL/PLATELET
Abs Immature Granulocytes: 0.04 10*3/uL (ref 0.00–0.07)
Basophils Absolute: 0.1 10*3/uL (ref 0.0–0.1)
Basophils Relative: 1 %
Eosinophils Absolute: 0.3 10*3/uL (ref 0.0–0.5)
Eosinophils Relative: 3 %
HCT: 35.2 % — ABNORMAL LOW (ref 36.0–46.0)
Hemoglobin: 10.9 g/dL — ABNORMAL LOW (ref 12.0–15.0)
Immature Granulocytes: 0 %
Lymphocytes Relative: 14 %
Lymphs Abs: 1.5 10*3/uL (ref 0.7–4.0)
MCH: 27.3 pg (ref 26.0–34.0)
MCHC: 31 g/dL (ref 30.0–36.0)
MCV: 88 fL (ref 80.0–100.0)
Monocytes Absolute: 0.8 10*3/uL (ref 0.1–1.0)
Monocytes Relative: 7 %
Neutro Abs: 8.1 10*3/uL — ABNORMAL HIGH (ref 1.7–7.7)
Neutrophils Relative %: 75 %
Platelets: 393 10*3/uL (ref 150–400)
RBC: 4 MIL/uL (ref 3.87–5.11)
RDW: 13.1 % (ref 11.5–15.5)
WBC: 10.8 10*3/uL — ABNORMAL HIGH (ref 4.0–10.5)
nRBC: 0 % (ref 0.0–0.2)

## 2022-09-05 MED ORDER — DOXYCYCLINE HYCLATE 100 MG PO TABS
100.0000 mg | ORAL_TABLET | Freq: Once | ORAL | Status: AC
  Administered 2022-09-05: 100 mg via ORAL
  Filled 2022-09-05: qty 1

## 2022-09-05 MED ORDER — DOXYCYCLINE HYCLATE 100 MG PO CAPS: 100.0000 mg | ORAL_CAPSULE | Freq: Two times a day (BID) | ORAL | 0 refills | Status: AC

## 2022-09-05 NOTE — ED Provider Notes (Signed)
[facility] EMERGENCY DEPARTMENT Provider Note   CSN: [id number] Arrival date & time: 09/05/22  1626     History  Chief Complaint  Patient presents with   Wound Check   Abscess    Audrey Bradley is a 56 y.o. female.  Patient presents emergency department for evaluation of abscess in the area of the right hip.  Patient had a total hip replacement in August 2023.  Patient was at a concert and walking a lot over the weekend (today is Tuesday).  On Sunday she noted a firm area that was somewhat tender over the lateral right hip.  Today while she was on the toilet, the area broke open and drained a copious amount of pus.  This continues to drain upon emergency department arrival.  She denies fever, chills, nausea or vomiting.  She is able to ambulate without difficulty and does not have pain in the groin or with bearing weight.      Home Medications Prior to Admission medications   Medication Sig Start Date End Date Taking? Authorizing Provider  albuterol (ACCUNEB) 1.25 MG/3ML nebulizer solution Take 1 ampule by nebulization every 6 (six) hours as needed for wheezing.    [provider]  albuterol (VENTOLIN HFA) 108 (90 Base) MCG/ACT inhaler Inhale 2 puffs into the lungs every 6 (six) hours as needed for wheezing or shortness of breath.    [provider]  cyclobenzaprine (FLEXERIL) 10 MG tablet Take 1 tablet (10 mg total) by mouth 3 (three) times daily as needed for muscle spasms. 06/14/22   Eartha Inch, PA  estradiol (ESTRACE) 0.1 MG/GM vaginal cream Place 2 g vaginally 3 (three) times a week. 05/19/22   [provider]  etodolac (LODINE) 500 MG tablet Take 500 mg by mouth 2 (two) times daily. 05/19/22   [provider]  levothyroxine (SYNTHROID) 50 MCG tablet Take 50 mcg by mouth at bedtime. 03/20/22   [provider]  loratadine (CLARITIN) 10 MG tablet Take 10 mg by mouth daily as needed for allergies.    [provider]  mometasone (NASONEX) 50 MCG/ACT nasal spray Place 2 sprays into the nose daily as needed (allergies).    [provider]  oxyCODONE (OXY IR/ROXICODONE) 5 MG immediate release tablet Take 1-2 tablets (5-10 mg total) by mouth every 6 (six) hours as needed for severe pain. 06/14/22   Eartha Inch, PA  topiramate (TOPAMAX) 50 MG tablet Take 50 mg by mouth at bedtime.    [provider]  traMADol (ULTRAM) 50 MG tablet Take 1 tablet (50 mg total) by mouth every 6 (six) hours as needed for moderate pain. 06/14/22   Eartha Inch, PA  zolpidem (AMBIEN) 10 MG tablet Take 10 mg by mouth at bedtime.    [provider]      Allergies    Amoxicillin-pot clavulanate, Diazepam, Prednisone, Erythromycin, and Predicort [prednisolone]    Review of Systems   Review of Systems  Physical Exam Updated Vital Signs BP 139/77 (BP Location: Right Arm)   Pulse 84   Temp 99 F (37.2 C)   Resp 20   Ht 5\' 3"  (1.6 m)   Wt 116.1 kg   SpO2 100%   BMI 45.35 kg/m   Physical Exam Vitals and nursing note reviewed.  Constitutional:      General: She is not in acute distress.    Appearance: She is well-developed.  HENT:     Head: Normocephalic and atraumatic.  Right Ear: External ear normal.     Left Ear: External ear normal.     Nose: Nose normal.  Eyes:     Conjunctiva/sclera: Conjunctivae normal.  Cardiovascular:     Rate and Rhythm: Normal rate and regular rhythm.     Heart sounds: No murmur heard. Pulmonary:     Effort: No respiratory distress.     Breath sounds: No wheezing, rhonchi or rales.  Abdominal:     Palpations: Abdomen is soft.     Tenderness: There is no abdominal tenderness. There is no guarding or rebound.  Musculoskeletal:     Cervical back: Normal range of motion and neck supple.     Right lower leg: No edema.     Left lower leg: No edema.  Skin:    General: Skin is warm and dry.     Findings: No rash.     Comments: There is a  irregular area of induration over the right hip, measuring approximately 8 cm, central skin opening draining brownish fluid, blood-tinged.  There is surrounding cellulitis and warmth.  Mild associated tenderness.  Neurological:     General: No focal deficit present.     Mental Status: She is alert. Mental status is at baseline.     Motor: No weakness.  Psychiatric:        Mood and Affect: Mood normal.     ED Results / Procedures / Treatments   Labs (all labs ordered are listed, but only abnormal results are displayed) Labs Reviewed  CBC WITH DIFFERENTIAL/PLATELET - Abnormal; Notable for the following components:      Result Value   WBC 10.8 (*)    Hemoglobin 10.9 (*)    HCT 35.2 (*)    Neutro Abs 8.1 (*)    All other components within normal limits  BASIC METABOLIC PANEL - Abnormal; Notable for the following components:   Calcium 8.2 (*)    All other components within normal limits    EKG None  Radiology No results found.  Procedures Procedures    Medications Ordered in ED Medications - No data to display  ED Course/ Medical Decision Making/ A&P    Patient seen and examined. History obtained directly from patient.   Labs/EKG: Ordered CBC, BMP.  Imaging: None ordered, considered CT imaging, however symptoms and exam most consistent with cutaneous superficial abscess and cellulitis.  Medications/Fluids: If labs are okay, anticipate starting oral antibiotics.  Most recent vital signs reviewed and are as follows: BP 139/77 (BP Location: Right Arm)   Pulse 84   Temp 99 F (37.2 C)   Resp 20   Ht 5\' 3"  (1.6 m)   Wt 116.1 kg   SpO2 100%   BMI 45.35 kg/m   Initial impression: Right hip cutaneous abscess with associated cellulitis, patient does not appear to have severe sepsis at this time  6:45 PM Reassessment performed. Patient appears stable.  Labs personally reviewed and interpreted including: CBC with minimally elevated white blood cell count at 10.8,  hemoglobin 10.9 slightly low; BMP without any concerning findings.  Reviewed pertinent lab work and imaging with patient at bedside. Questions answered.   Most current vital signs reviewed and are as follows: BP 139/77 (BP Location: Right Arm)   Pulse 84   Temp 99 F (37.2 C)   Resp 20   Ht 5\' 3"  (1.6 m)   Wt 116.1 kg   SpO2 100%   BMI 45.35 kg/m   Plan: Discharge to home.  Prescriptions written for: Doxycycline  Other home care instructions discussed: Warm compresses, wound care  ED return instructions discussed: Fever fever, worsening hip pain, trouble walking, expanding area of redness.  Follow-up instructions discussed: Patient encouraged to follow-up with their PCP in 2 days for recheck if not improving.                           Medical Decision Making Amount and/or Complexity of Data Reviewed Labs: ordered.  Risk Prescription drug management.   Patient with cutaneous abscess and cellulitis of the right hip region.  No signs of sepsis today.  Patient has not been febrile.  White blood cell count minimally elevated.  She did not have any difficulty with range of motion of the hip or with ambulation.  At this point, I have very low concern for a deep infection involving the hip joint or in the vicinity to the hip joint.  The abscess has thoroughly drained today and patient has approximately 1 cm opening in the skin.  She will take oral antibiotics and do typical care post abscess drainage.        Final Clinical Impression(s) / ED Diagnoses Final diagnoses:  Abscess of hip, right  Cellulitis of right hip    Rx / DC Orders ED Discharge Orders          Ordered    Apply dressing        09/05/22 1842    doxycycline (VIBRAMYCIN) 100 MG capsule  2 times daily        09/05/22 1845              Carlisle Cater, PA-C 09/05/22 1847    Fredia Sorrow, MD 09/06/22 0020

## 2022-09-05 NOTE — ED Triage Notes (Addendum)
Right THR on 8/21. Reports going to the restroom this morning, sat down and "stuff" started "gushing out" Blood and clear drainage. Pt states she went to a concert and did a lot of walking this weekend, and moved more then normal. Endorses good wound healing until today.   Upon assessment, there appears to be an abscess that is draining. Skin around area is red and pt describes area as "burning"

## 2022-09-05 NOTE — Discharge Instructions (Signed)
Please read and follow all provided instructions.  Your diagnoses today include:  1. Abscess of hip, right   2. Cellulitis of right hip     Tests performed today include: Vital signs. See below for your results today.  Complete blood cell count: White blood cell count was minimally elevated Basic metabolic panel: No problems  Medications prescribed:  Doxycycline - antibiotic  You have been prescribed an antibiotic medicine: take the entire course of medicine even if you are feeling better. Stopping early can cause the antibiotic not to work.  Take any prescribed medications only as directed.   Home care instructions:  Follow any educational materials contained in this packet Soak the area for 10 to 15 minutes in warm water 1-2 times a day or apply warm compresses over the next couple days to help promote drainage.  Follow-up instructions: Return to the Emergency Department in 48 hours or see your doctor for a recheck if your symptoms are not significantly improved.  If your symptoms progress or get worse, or if you develop difficulty walking or pain in the hip, you may need CT of the area. But at the current time you appear to have a superficial abscess and skin infection.    Return instructions:  Return to the Emergency Department if you have: Fever Worsening symptoms Worsening pain Worsening swelling Redness of the skin that moves away from the affected area, especially if it streaks away from the affected area  Any other emergent concerns  Your vital signs today were: BP 139/77 (BP Location: Right Arm)   Pulse 84   Temp 99 F (37.2 C)   Resp 20   Ht 5\' 3"  (1.6 m)   Wt 116.1 kg   SpO2 100%   BMI 45.35 kg/m  If your blood pressure (BP) was elevated above 135/85 this visit, please have this repeated by your doctor within one month. --------------

## 2022-09-06 ENCOUNTER — Ambulatory Visit: Payer: BC Managed Care – PPO | Admitting: Physical Therapy

## 2022-09-07 ENCOUNTER — Encounter: Payer: BC Managed Care – PPO | Admitting: Physical Therapy

## 2022-09-07 NOTE — Therapy (Signed)
OUTPATIENT PHYSICAL THERAPY TREATMENT NOTE   Patient Name: Audrey Bradley MRN: [id number] DOB:[birth year], 56 y.o., female Today's Date: 09/08/2022  PCP:  Lanier Prude, PA   REFERRING PROVIDER: Jearld Lesch, PA   END OF SESSION:   PT End of Session - 09/08/22 0847     Visit Number 2    Date for PT Re-Evaluation 10/19/22    Authorization Type BCBS    PT Start Time 0847    PT Stop Time 0928    PT Time Calculation (min) 41 min    Activity Tolerance Patient tolerated treatment well    Behavior During Therapy WFL for tasks assessed/performed             Past Medical History:  Diagnosis Date   Anxiety    Asthma    Seasonal   Hypothyroidism    Pneumonia    PONV (postoperative nausea and vomiting)    Past Surgical History:  Procedure Laterality Date   ABDOMINAL HYSTERECTOMY     APPENDECTOMY     BARIATRIC SURGERY     lapband   CHOLECYSTECTOMY     FOOT SURGERY Left    tendon repair   MASS EXCISION     left underarm   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   Procedure: TOTAL HIP ARTHROPLASTY ANTERIOR APPROACH;  Surgeon: Gaynelle Arabian, MD;  Location: [facility];  Service: Orthopedics;  Laterality: Right;   Patient Active Problem List   Diagnosis Date Noted   Osteoarthritis of right hip 06/12/2022   Unilateral primary osteoarthritis, right hip 10/07/2020   Bilateral primary osteoarthritis of hip 09/30/2020   Morbid exogenous obesity ([place]) 09/30/2020   BMI 50.0-59.9, adult [facility]) 09/30/2020   Lapband Vanguard 11cm June 2007 [place], [place], [place]) 09/12/2013    REFERRING DIAG: Z51.89 (ICD-10-CM) - Encounter for other specified aftercare    THERAPY DIAG:  Muscle weakness (generalized)  Abnormal posture  Pain in right hip  Rationale for Evaluation and Treatment Rehabilitation  PERTINENT HISTORY: PMH: anxiety, asthma, hypothyroidism   PRECAUTIONS: None  SUBJECTIVE:                                                                                                                                                                                       SUBJECTIVE STATEMENT:  I had an infection, went to ED and PCP. She is now on antibiotics and it is progressing well. No precautions at this time per pt. Walking less painful.    PAIN:  Are you having pain? Yes: NPRS scale: 11/25/08 Pain location: Lt  Pain description: Dull soreness Aggravating factors: More stable now Relieving factors: The drainage  probably helped the most  OBJECTIVE: (objective measures completed at initial evaluation unless otherwise dated)  DIAGNOSTIC FINDINGS:    PATIENT SURVEYS:  FOTO 52% goal 68%   COGNITION: Overall cognitive status: Within functional limits for tasks assessed                         SENSATION: Limited sensation Rt lateral and anterior thigh   EDEMA:      MUSCLE LENGTH: 08/24/22: Hamstrings: Right 70 deg; Left 75 deg Thomas test: Right positive 10 deg   POSTURE: flexed trunk  and lacks full knee ext on Rt, lacks neutral Rt hip positioning (flexed) in standing   PALPATION: Altered sensation along lateral and anterior Rt thigh   LOWER EXTREMITY ROM:   Active ROM Right eval Left eval  Hip flexion 100 100  Hip extension Lacks 10 deg 5  Hip abduction      Hip adduction      Hip internal rotation 15 15  Hip external rotation 45 45  Knee flexion   WFL  Knee extension Lacks 10 deg full  Ankle dorsiflexion      Ankle plantarflexion      Ankle inversion      Ankle eversion       (Blank rows = not tested)   LOWER EXTREMITY MMT:   MMT Right eval Left eval  Hip flexion 4-    Hip extension 4    Hip abduction 4    Hip adduction 5    Hip internal rotation 4+    Hip external rotation 3+    Knee flexion 4 4  Knee extension 4 4  Ankle dorsiflexion      Ankle plantarflexion      Ankle inversion      Ankle eversion       (Blank rows = not tested)   LOWER EXTREMITY SPECIAL TESTS:      FUNCTIONAL TESTS:  Eval 08/24/22: 5x sit to  stand: 19 sec with UE use on chair TUG: 26" with SPC 3' walk test: 46' with 2-wheeled walker    GAIT: Eval 08/24/22: Distance walked: 303' (3' walk test) Assistive device utilized: Environmental consultant - 2 wheeled Level of assistance: Modified independence Comments: lacks terminal knee extension on Rt, flexed trunk over Rt hip, lacks Rt hip extension and gluteal activation     TODAY'S TREATMENT:                                                                                                                              DATE:   09/08/22: Nustep L1 5 min with PTA present to monitor. Seated hamstring stretch RT 2x 20 sec, good technique Seated ball squeeze 5 sec hold 10x Ball squeeze with LAQ 5 sec hold 10x Standing at [employer]; heel raises 10x, hip abduction 10x Bil, RTLE step back (small hip extension) 10x  Sit to stand 10x from low mat  08/24/22: Encouraged Pt to perform 3 daily  short walks with walker to work on gait endurance. Initiated HEP below for stretching.     PATIENT EDUCATION:  Education details: Access Code: [email or web address] Person educated: Patient Education method: Consulting civil engineer, Demonstration, Verbal cues, and Handouts Education comprehension: verbalized understanding and returned demonstration   HOME EXERCISE PROGRAM: Access Code: [email or web address] URL: [email or web address] Date: 08/24/2022 Prepared by: Venetia Night Beuhring   Exercises - Gastroc Stretch on Wall  - 2 x daily - 7 x weekly - 1 sets - 3 reps - 20 hold - Seated Hamstring Stretch  - 2 x daily - 7 x weekly - 1 sets - 3 reps - 20 hold - Hip Flexor Stretch with Chair  - 2 x daily - 7 x weekly - 2 sets - 10 reps   ASSESSMENT:   CLINICAL IMPRESSION: Pt had an infection in her hip (superficial) which opened up a few days ago. Pt went to ED and was given antibiotics and is doing much better now. Pt had no hip pain with her exercises today, just hip weakness,   OBJECTIVE IMPAIRMENTS: Abnormal gait, decreased activity  tolerance, difficulty walking, decreased ROM, decreased strength, impaired flexibility, impaired sensation, improper body mechanics, postural dysfunction, and pain.    ACTIVITY LIMITATIONS: lifting, bending, squatting, sleeping, stairs, and locomotion level   PARTICIPATION LIMITATIONS: meal prep, cleaning, laundry, shopping, community activity, occupation, and yard work   PERSONAL FACTORS: Time since onset of injury/illness/exacerbation and 1-2 comorbidities: bil knee OA, Lt hip OA  are also affecting patient's functional outcome.    REHAB POTENTIAL: Good   CLINICAL DECISION MAKING: Stable/uncomplicated   EVALUATION COMPLEXITY: Low     GOALS: Goals reviewed with patient? Yes   SHORT TERM GOALS: Target date: 09/21/2022  Pt will be ind with initial HEP without exacerbation of symptoms. Baseline: Goal status: 09/08/22: Met   2.  Improve 5x sit to stand to 14 sec or less Baseline: 19 sec Goal status: INITIAL   3.  Improve TUG to 18 sec or less with SPC Baseline: 26 sec with SPC Goal status: INITIAL       LONG TERM GOALS: Target date: 10/19/2022    Pt will be ind with advanced HEP and understand how to safely progress Baseline:  Goal status: INITIAL   2.  Pt will improve FOTO score to at least 68% to demo improved function. Baseline: 52% Goal status: INITIAL   3.  Pt will be able to sleep on Rt side for up to 2 hours before needing to change positions Baseline: up to 1 hour now Goal status: INITIAL   4.  Pt will demo symmetrical gait pattern with full Rt hip and knee extension within 3' walk test with SPC vs walker covering at least 300' Baseline: 303' with walker Goal status: INITIAL   5.  Pt will achieve at least 4+/5 strength of Rt hip and thigh to allow for improved squatting, stairs and gait Baseline:  Goal status: INITIAL   6.  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 Baseline:  Goal status: INITIAL      PLAN:   PT FREQUENCY: 2x/week   PT DURATION: 8 weeks   PLANNED INTERVENTIONS: Therapeutic exercises, Therapeutic activity, Neuromuscular re-education, Balance training, Gait training, Patient/Family education, Self Care, Joint mobilization, Stair training, Aquatic Therapy, Dry Needling, Electrical stimulation, Spinal mobilization, Cryotherapy, Moist heat, Taping, Vasopneumatic device, Ionotophoresis 20m/ml Dexamethasone, and Manual therapy   PLAN FOR NEXT SESSION: Nustep, RT hip strength and  ROM   Myrene Galas, PTA 09/08/22 9:24 AM               Helio Lack, PTA 09/08/2022, 9:24 AM

## 2022-09-08 ENCOUNTER — Ambulatory Visit: Payer: BC Managed Care – PPO | Admitting: Physical Therapy

## 2022-09-08 ENCOUNTER — Encounter: Payer: Self-pay | Admitting: Physical Therapy

## 2022-09-08 DIAGNOSIS — M6281 Muscle weakness (generalized): Secondary | ICD-10-CM | POA: Diagnosis not present

## 2022-09-08 DIAGNOSIS — R293 Abnormal posture: Secondary | ICD-10-CM

## 2022-09-08 DIAGNOSIS — M25551 Pain in right hip: Secondary | ICD-10-CM

## 2022-09-12 ENCOUNTER — Ambulatory Visit: Payer: BC Managed Care – PPO | Admitting: Physical Therapy

## 2022-09-12 DIAGNOSIS — M25551 Pain in right hip: Secondary | ICD-10-CM

## 2022-09-12 DIAGNOSIS — M6281 Muscle weakness (generalized): Secondary | ICD-10-CM

## 2022-09-12 DIAGNOSIS — R293 Abnormal posture: Secondary | ICD-10-CM

## 2022-09-12 NOTE — Therapy (Signed)
OUTPATIENT PHYSICAL THERAPY TREATMENT NOTE   Patient Name: Audrey Bradley MRN: [id number] DOB:July 11, 1966, 56 y.o., female Today's Date: 09/12/2022  PCP:  Lanier Prude, PA   REFERRING PROVIDER: Jearld Lesch, PA   END OF SESSION:   PT End of Session - 09/12/22 0936     Visit Number 3    Date for PT Re-Evaluation 10/19/22    Authorization Type BCBS    PT Start Time [phone]    PT Stop Time 1015    PT Time Calculation (min) 44 min             Past Medical History:  Diagnosis Date   Anxiety    Asthma    Seasonal   Hypothyroidism    Pneumonia    PONV (postoperative nausea and vomiting)    Past Surgical History:  Procedure Laterality Date   ABDOMINAL HYSTERECTOMY     APPENDECTOMY     BARIATRIC SURGERY     lapband   CHOLECYSTECTOMY     FOOT SURGERY Left    tendon repair   MASS EXCISION     left underarm   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   Procedure: TOTAL HIP ARTHROPLASTY ANTERIOR APPROACH;  Surgeon: Gaynelle Arabian, MD;  Location: [facility];  Service: Orthopedics;  Laterality: Right;   Patient Active Problem List   Diagnosis Date Noted   Osteoarthritis of right hip 06/12/2022   Unilateral primary osteoarthritis, right hip 10/07/2020   Bilateral primary osteoarthritis of hip 09/30/2020   Morbid exogenous obesity ([place]) 09/30/2020   BMI 50.0-59.9, adult [facility]) 09/30/2020   Lapband Vanguard 11cm June 2007 [place], [place], [place]) 09/12/2013    REFERRING DIAG: Z51.89 (ICD-10-CM) - Encounter for other specified aftercare    THERAPY DIAG:  Muscle weakness (generalized)  Abnormal posture  Pain in right hip  Rationale for Evaluation and Treatment Rehabilitation  PERTINENT HISTORY: PMH: anxiety, asthma, hypothyroidism   PRECAUTIONS: None  SUBJECTIVE:                                                                                                                                                                                      SUBJECTIVE STATEMENT:   10  more days of antibiotics from surface staph infection.  Still draining but that's a good thing.  Supposed to go back to work Monday.  Can take a few steps without my cane.     PAIN:  Are you having pain? Yes: NPRS scale: 11/25/08 Pain location: Lt  Pain description: Dull soreness Aggravating factors: More stable now Relieving factors: The drainage  probably helped the most   OBJECTIVE: (objective measures completed at initial evaluation unless otherwise dated)  DIAGNOSTIC FINDINGS:    PATIENT SURVEYS:  FOTO 52% goal 68%   COGNITION: Overall cognitive status: Within functional limits for tasks assessed                         SENSATION: Limited sensation Rt lateral and anterior thigh   EDEMA:      MUSCLE LENGTH: 08/24/22: Hamstrings: Right 70 deg; Left 75 deg Thomas test: Right positive 10 deg   POSTURE: flexed trunk  and lacks full knee ext on Rt, lacks neutral Rt hip positioning (flexed) in standing   PALPATION: Altered sensation along lateral and anterior Rt thigh   LOWER EXTREMITY ROM:   Active ROM Right eval Left eval  Hip flexion 100 100  Hip extension Lacks 10 deg 5  Hip abduction      Hip adduction      Hip internal rotation 15 15  Hip external rotation 45 45  Knee flexion   WFL  Knee extension Lacks 10 deg full  Ankle dorsiflexion      Ankle plantarflexion      Ankle inversion      Ankle eversion       (Blank rows = not tested)   LOWER EXTREMITY MMT:   MMT Right eval Left eval  Hip flexion 4-    Hip extension 4    Hip abduction 4    Hip adduction 5    Hip internal rotation 4+    Hip external rotation 3+    Knee flexion 4 4  Knee extension 4 4  Ankle dorsiflexion      Ankle plantarflexion      Ankle inversion      Ankle eversion       (Blank rows = not tested)   LOWER EXTREMITY SPECIAL TESTS:      FUNCTIONAL TESTS:  Eval 08/24/22: 5x sit to stand: 19 sec with UE use on chair TUG: 26" with SPC 3' walk test: 37' with 2-wheeled walker     GAIT: Eval 08/24/22: Distance walked: 303' (3' walk test) Assistive device utilized: Environmental consultant - 2 wheeled Level of assistance: Modified independence Comments: lacks terminal knee extension on Rt, flexed trunk over Rt hip, lacks Rt hip extension and gluteal activation     TODAY'S TREATMENT:                                                                                                                               DATE:  09/12/22: Nustep L5 9 min with PT present to monitor. Sidelying clams 2x10 Right hip abduction 5x Bridge 10x Side stepping laps at the counter 3 laps WB on right with left step touches 2x5 (challenging) At the counter hip extension right/left 10x Sit to stand 10x from low mat uses light hands on thighs 2 inch step up 10x with railings 4 inch step up 10x with railings Floor slider right/left hip abduction with UE support  10x  Seated hamstring stretch RT 2x 20 sec    09/08/22: Nustep L1 5 min with PTA present to monitor. Seated hamstring stretch RT 2x 20 sec, good technique Seated ball squeeze 5 sec hold 10x Ball squeeze with LAQ 5 sec hold 10x Standing at [employer]; heel raises 10x, hip abduction 10x Bil, RTLE step back (small hip extension) 10x  Sit to stand 10x from low mat    PATIENT EDUCATION:  Education details: Access Code: [email or web address] Person educated: Patient Education method: Consulting civil engineer, Demonstration, Verbal cues, and Handouts Education comprehension: verbalized understanding and returned demonstration   HOME EXERCISE PROGRAM: Access Code: [email or web address] URL: [email or web address] Date: 09/12/2022 Prepared by: [place] on Wall  - 2 x daily - 7 x weekly - 1 sets - 3 reps - 20 hold - Seated Hamstring Stretch  - 2 x daily - 7 x weekly - 1 sets - 3 reps - 20 hold - Hip Flexor Stretch with Chair  - 2 x daily - 7 x weekly - 2 sets - 10 reps - Clamshell (Mirrored)  - 1 x daily - 7 x weekly - 1 sets - 10  reps - Supine Bridge  - 1 x daily - 7 x weekly - 1 sets - 10 reps - Sit to Stand  - 1 x daily - 7 x weekly - 1 sets - 10 reps - Side Stepping with Counter Support  - 1 x daily - 7 x weekly - 1 sets - 10 reps - Standing Forward Step Taps with Counter Support  - 1 x daily - 7 x weekly - 1 sets - 10 reps ASSESSMENT:   CLINICAL IMPRESSION: Able to increase exercise challenge level today.   Significant weakness in right gluteals with quick fatigue with right full weight bearing ex's.  Verbal cues for proper recruitment of glute medius muscle activation needed for standing and walking longer periods of time without a pelvic drop.  She demonstrates good compliance with HEP.  No restrictions regarding infection other than no pool.      ACTIVITY LIMITATIONS: lifting, bending, squatting, sleeping, stairs, and locomotion level   PARTICIPATION LIMITATIONS: meal prep, cleaning, laundry, shopping, community activity, occupation, and yard work   PERSONAL FACTORS: Time since onset of injury/illness/exacerbation and 1-2 comorbidities: bil knee OA, Lt hip OA  are also affecting patient's functional outcome.    REHAB POTENTIAL: Good   CLINICAL DECISION MAKING: Stable/uncomplicated   EVALUATION COMPLEXITY: Low     GOALS: Goals reviewed with patient? Yes   SHORT TERM GOALS: Target date: 09/21/2022  Pt will be ind with initial HEP without exacerbation of symptoms. Baseline: Goal status: 09/08/22: Met   2.  Improve 5x sit to stand to 14 sec or less Baseline: 19 sec Goal status: INITIAL   3.  Improve TUG to 18 sec or less with SPC Baseline: 26 sec with SPC Goal status: INITIAL       LONG TERM GOALS: Target date: 10/19/2022    Pt will be ind with advanced HEP and understand how to safely progress Baseline:  Goal status: INITIAL   2.  Pt will improve FOTO score to at least 68% to demo improved function. Baseline: 52% Goal status: INITIAL   3.  Pt will be able to sleep on Rt side for up to 2  hours before needing to change positions Baseline: up to 1 hour now Goal status: INITIAL   4.  Pt will demo symmetrical gait pattern with full  Rt hip and knee extension within 3' walk test with SPC vs walker covering at least 300' Baseline: 303' with walker Goal status: INITIAL   5.  Pt will achieve at least 4+/5 strength of Rt hip and thigh to allow for improved squatting, stairs and gait Baseline:  Goal status: INITIAL   6.  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 Baseline:  Goal status: INITIAL     PLAN:   PT FREQUENCY: 2x/week   PT DURATION: 8 weeks   PLANNED INTERVENTIONS: Therapeutic exercises, Therapeutic activity, Neuromuscular re-education, Balance training, Gait training, Patient/Family education, Self Care, Joint mobilization, Stair training, Aquatic Therapy, Dry Needling, Electrical stimulation, Spinal mobilization, Cryotherapy, Moist heat, Taping, Vasopneumatic device, Ionotophoresis 69m/ml Dexamethasone, and Manual therapy   PLAN FOR NEXT SESSION: Nustep, RT hip strength and ROM  SRuben Im PT 09/12/22 10:26 AM Phone: [phone]: [phone]

## 2022-09-13 ENCOUNTER — Encounter: Payer: Self-pay | Admitting: Physical Therapy

## 2022-09-13 ENCOUNTER — Ambulatory Visit: Payer: BC Managed Care – PPO | Admitting: Physical Therapy

## 2022-09-13 DIAGNOSIS — M6281 Muscle weakness (generalized): Secondary | ICD-10-CM | POA: Diagnosis not present

## 2022-09-13 DIAGNOSIS — R293 Abnormal posture: Secondary | ICD-10-CM

## 2022-09-13 DIAGNOSIS — M25551 Pain in right hip: Secondary | ICD-10-CM

## 2022-09-13 NOTE — Therapy (Signed)
OUTPATIENT PHYSICAL THERAPY TREATMENT NOTE   Patient Name: Audrey Bradley MRN: [id number] DOB:1966-01-08, 56 y.o., female Today's Date: 09/13/2022  PCP:  Lanier Prude, PA   REFERRING PROVIDER: Jearld Lesch, PA   END OF SESSION:   PT End of Session - 09/13/22 0849     Visit Number 4    Date for PT Re-Evaluation 10/19/22    Authorization Type BCBS    PT Start Time 0845    PT Stop Time 0926    PT Time Calculation (min) 41 min    Activity Tolerance Patient tolerated treatment well    Behavior During Therapy WFL for tasks assessed/performed              Past Medical History:  Diagnosis Date   Anxiety    Asthma    Seasonal   Hypothyroidism    Pneumonia    PONV (postoperative nausea and vomiting)    Past Surgical History:  Procedure Laterality Date   ABDOMINAL HYSTERECTOMY     APPENDECTOMY     BARIATRIC SURGERY     lapband   CHOLECYSTECTOMY     FOOT SURGERY Left    tendon repair   MASS EXCISION     left underarm   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   Procedure: TOTAL HIP ARTHROPLASTY ANTERIOR APPROACH;  Surgeon: Gaynelle Arabian, MD;  Location: [facility];  Service: Orthopedics;  Laterality: Right;   Patient Active Problem List   Diagnosis Date Noted   Osteoarthritis of right hip 06/12/2022   Unilateral primary osteoarthritis, right hip 10/07/2020   Bilateral primary osteoarthritis of hip 09/30/2020   Morbid exogenous obesity ([place]) 09/30/2020   BMI 50.0-59.9, adult [facility]) 09/30/2020   Lapband Vanguard 11cm June 2007 [place], [place], [place]) 09/12/2013    REFERRING DIAG: Z51.89 (ICD-10-CM) - Encounter for other specified aftercare    THERAPY DIAG:  Muscle weakness (generalized)  Abnormal posture  Pain in right hip  Rationale for Evaluation and Treatment Rehabilitation  PERTINENT HISTORY: PMH: anxiety, asthma, hypothyroidism   PRECAUTIONS: None  SUBJECTIVE:                                                                                                                                                                                       SUBJECTIVE STATEMENT:   I am so sore from yesterday's session.  I also did 2 walks and another round of the exercises after PT so may have overdone it.  I am stiff and sore.  Almost didn't want to come today.   PAIN:  Are you having pain? Yes: NPRS scale: 3-4/10 Pain location: Lt  Pain description: Dull soreness Aggravating factors: More stable  now Relieving factors: The drainage  probably helped the most   OBJECTIVE: (objective measures completed at initial evaluation unless otherwise dated)  DIAGNOSTIC FINDINGS:    PATIENT SURVEYS:  FOTO 52% goal 68%   COGNITION: Overall cognitive status: Within functional limits for tasks assessed                         SENSATION: Limited sensation Rt lateral and anterior thigh   EDEMA:      MUSCLE LENGTH: 08/24/22: Hamstrings: Right 70 deg; Left 75 deg Thomas test: Right positive 10 deg   POSTURE: flexed trunk  and lacks full knee ext on Rt, lacks neutral Rt hip positioning (flexed) in standing   PALPATION: Altered sensation along lateral and anterior Rt thigh   LOWER EXTREMITY ROM:   Active ROM Right eval Left eval  Hip flexion 100 100  Hip extension Lacks 10 deg 5  Hip abduction      Hip adduction      Hip internal rotation 15 15  Hip external rotation 45 45  Knee flexion   WFL  Knee extension Lacks 10 deg full  Ankle dorsiflexion      Ankle plantarflexion      Ankle inversion      Ankle eversion       (Blank rows = not tested)   LOWER EXTREMITY MMT:   MMT Right eval Left eval  Hip flexion 4-    Hip extension 4    Hip abduction 4    Hip adduction 5    Hip internal rotation 4+    Hip external rotation 3+    Knee flexion 4 4  Knee extension 4 4  Ankle dorsiflexion      Ankle plantarflexion      Ankle inversion      Ankle eversion       (Blank rows = not tested)   LOWER EXTREMITY SPECIAL TESTS:      FUNCTIONAL  TESTS:  Eval 08/24/22: 5x sit to stand: 19 sec with UE use on chair TUG: 26" with SPC 3' walk test: 19' with 2-wheeled walker    GAIT: Eval 08/24/22: Distance walked: 303' (3' walk test) Assistive device utilized: Environmental consultant - 2 wheeled Level of assistance: Modified independence Comments: lacks terminal knee extension on Rt, flexed trunk over Rt hip, lacks Rt hip extension and gluteal activation     TODAY'S TREATMENT:                                                                                                                               DATE:  09/13/22: NuStep L3 x 9', PT present to monitor and discuss symptoms, less resistance today due to soreness from yesterday's session Seated Rt hamstring stretch 2x20" Supine Rt piriformis stretch pull across and push away 2x20" each Supine lower trunk rotation x 20 Supine Rt SLR 2x10 Supine bridge x10 Sit to stand 2x5 from  low mat uses light hands on thighs (sets of 5 due to soreness) Counter bil heel raise x15 Counter sidestepping x 3 laps Weight shifting on trampoline x 2' square stance, 2' stagger stance Rt foot forward  09/12/22: Nustep L5 9 min with PT present to monitor. Sidelying clams 2x10 Right hip abduction 5x Bridge 10x Side stepping laps at the counter 3 laps WB on right with left step touches 2x5 (challenging) At the counter hip extension right/left 10x Sit to stand 10x from low mat uses light hands on thighs 2 inch step up 10x with railings 4 inch step up 10x with railings Floor slider right/left hip abduction with UE support  10x  Seated hamstring stretch RT 2x 20 sec    09/08/22: Nustep L1 5 min with PTA present to monitor. Seated hamstring stretch RT 2x 20 sec, good technique Seated ball squeeze 5 sec hold 10x Ball squeeze with LAQ 5 sec hold 10x Standing at [employer]; heel raises 10x, hip abduction 10x Bil, RTLE step back (small hip extension) 10x  Sit to stand 10x from low mat    PATIENT EDUCATION:   Education details: Access Code: [email or web address] Person educated: Patient Education method: Consulting civil engineer, Demonstration, Verbal cues, and Handouts Education comprehension: verbalized understanding and returned demonstration   HOME EXERCISE PROGRAM: Access Code: [email or web address] URL: [email or web address] Date: 09/12/2022 Prepared by: [employer]  - 2 x daily - 7 x weekly - 1 sets - 3 reps - 20 hold - Seated Hamstring Stretch  - 2 x daily - 7 x weekly - 1 sets - 3 reps - 20 hold - Hip Flexor Stretch with Chair  - 2 x daily - 7 x weekly - 2 sets - 10 reps - Clamshell (Mirrored)  - 1 x daily - 7 x weekly - 1 sets - 10 reps - Supine Bridge  - 1 x daily - 7 x weekly - 1 sets - 10 reps - Sit to Stand  - 1 x daily - 7 x weekly - 1 sets - 10 reps - Side Stepping with Counter Support  - 1 x daily - 7 x weekly - 1 sets - 10 reps - Standing Forward Step Taps with Counter Support  - 1 x daily - 7 x weekly - 1 sets - 10 reps ASSESSMENT:   CLINICAL IMPRESSION: Pt reported signif soreness from yesterday's session so modified as needed around a very sore gluteal group on Rt.  She continues to need intermittent cueing for heel strike on Rt with gait with SPC.  She has poor gluteal recruitment on Rt with gait.  Needed mod/max cueing with weight shifting on trampoline in square stance today to avoid "tail wag".      ACTIVITY LIMITATIONS: lifting, bending, squatting, sleeping, stairs, and locomotion level   PARTICIPATION LIMITATIONS: meal prep, cleaning, laundry, shopping, community activity, occupation, and yard work   PERSONAL FACTORS: Time since onset of injury/illness/exacerbation and 1-2 comorbidities: bil knee OA, Lt hip OA  are also affecting patient's functional outcome.    REHAB POTENTIAL: Good   CLINICAL DECISION MAKING: Stable/uncomplicated   EVALUATION COMPLEXITY: Low     GOALS: Goals reviewed with patient? Yes   SHORT TERM GOALS: Target date:  09/21/2022  Pt will be ind with initial HEP without exacerbation of symptoms. Baseline: Goal status: 09/08/22: Met   2.  Improve 5x sit to stand to 14 sec or less Baseline: 19 sec Goal status: INITIAL   3.  Improve  TUG to 18 sec or less with SPC Baseline: 26 sec with SPC Goal status: INITIAL       LONG TERM GOALS: Target date: 10/19/2022    Pt will be ind with advanced HEP and understand how to safely progress Baseline:  Goal status: INITIAL   2.  Pt will improve FOTO score to at least 68% to demo improved function. Baseline: 52% Goal status: INITIAL   3.  Pt will be able to sleep on Rt side for up to 2 hours before needing to change positions Baseline: up to 1 hour now Goal status: INITIAL   4.  Pt will demo symmetrical gait pattern with full Rt hip and knee extension within 3' walk test with SPC vs walker covering at least 300' Baseline: 303' with walker Goal status: INITIAL   5.  Pt will achieve at least 4+/5 strength of Rt hip and thigh to allow for improved squatting, stairs and gait Baseline:  Goal status: INITIAL   6.  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 Baseline:  Goal status: INITIAL     PLAN:   PT FREQUENCY: 2x/week   PT DURATION: 8 weeks   PLANNED INTERVENTIONS: Therapeutic exercises, Therapeutic activity, Neuromuscular re-education, Balance training, Gait training, Patient/Family education, Self Care, Joint mobilization, Stair training, Aquatic Therapy, Dry Needling, Electrical stimulation, Spinal mobilization, Cryotherapy, Moist heat, Taping, Vasopneumatic device, Ionotophoresis 57m/ml Dexamethasone, and Manual therapy   PLAN FOR NEXT SESSION: did Pt return to work, [employer] RT hip strength and ROM  Kishia Shackett, PT 09/13/22 9:27 AM  Phone: [phone]: [phone]

## 2022-09-21 ENCOUNTER — Ambulatory Visit: Payer: BC Managed Care – PPO | Admitting: Physical Therapy

## 2022-09-21 DIAGNOSIS — R293 Abnormal posture: Secondary | ICD-10-CM

## 2022-09-21 DIAGNOSIS — M25551 Pain in right hip: Secondary | ICD-10-CM

## 2022-09-21 DIAGNOSIS — M6281 Muscle weakness (generalized): Secondary | ICD-10-CM | POA: Diagnosis not present

## 2022-09-21 NOTE — Therapy (Signed)
OUTPATIENT PHYSICAL THERAPY TREATMENT NOTE   Patient Name: Audrey Bradley MRN: [id number] DOB:September 13, 1966, 56 y.o., female Today's Date: 09/21/2022  PCP:  Lanier Prude, PA   REFERRING PROVIDER: Jearld Lesch, PA   END OF SESSION:   PT End of Session - 09/21/22 1527     Visit Number 5    Date for PT Re-Evaluation 10/19/22    Authorization Type BCBS    PT Start Time 1530    PT Stop Time 1608   PT Time Calculation (min) 38 min    Activity Tolerance Patient tolerated treatment well              Past Medical History:  Diagnosis Date   Anxiety    Asthma    Seasonal   Hypothyroidism    Pneumonia    PONV (postoperative nausea and vomiting)    Past Surgical History:  Procedure Laterality Date   ABDOMINAL HYSTERECTOMY     APPENDECTOMY     BARIATRIC SURGERY     lapband   CHOLECYSTECTOMY     FOOT SURGERY Left    tendon repair   MASS EXCISION     left underarm   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   Procedure: TOTAL HIP ARTHROPLASTY ANTERIOR APPROACH;  Surgeon: Gaynelle Arabian, MD;  Location: [facility];  Service: Orthopedics;  Laterality: Right;   Patient Active Problem List   Diagnosis Date Noted   Osteoarthritis of right hip 06/12/2022   Unilateral primary osteoarthritis, right hip 10/07/2020   Bilateral primary osteoarthritis of hip 09/30/2020   Morbid exogenous obesity ([place]) 09/30/2020   BMI 50.0-59.9, adult [facility]) 09/30/2020   Lapband Vanguard 11cm June 2007 [place], [place], [place]) 09/12/2013    REFERRING DIAG: Z51.89 (ICD-10-CM) - Encounter for other specified aftercare    THERAPY DIAG:  Muscle weakness (generalized)  Abnormal posture  Pain in right hip  Rationale for Evaluation and Treatment Rehabilitation  PERTINENT HISTORY: PMH: anxiety, asthma, hypothyroidism   PRECAUTIONS: None  SUBJECTIVE:                                                                                                                                                                                       SUBJECTIVE STATEMENT:    I'm tired.  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  Hard to adjust to the schedule.  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  In February I'll go to a different school.  The hip is alright just really tired.  Going on a field trip tomorrow in [place]  Last night I was able to walk down the hall at home without my cane.  I can put my sock  on better than I have in 3 years.  Going up stairs is a challenge but coming down stairs is easier.  I was able to shower without my shower chair for the first time last night.  I haven't done that in 3 years.     PAIN:  Are you having pain? Yes: NPRS scale: 3/10 Pain location: Lt  Pain description: Dull soreness Aggravating factors: More stable now Relieving factors: The drainage  probably helped the most   OBJECTIVE: (objective measures completed at initial evaluation unless otherwise dated)  DIAGNOSTIC FINDINGS:    PATIENT SURVEYS:  FOTO 52% goal 68%   COGNITION: Overall cognitive status: Within functional limits for tasks assessed                         SENSATION: Limited sensation Rt lateral and anterior thigh   EDEMA:      MUSCLE LENGTH: 08/24/22: Hamstrings: Right 70 deg; Left 75 deg Thomas test: Right positive 10 deg   POSTURE: flexed trunk  and lacks full knee ext on Rt, lacks neutral Rt hip positioning (flexed) in standing   PALPATION: Altered sensation along lateral and anterior Rt thigh   LOWER EXTREMITY ROM:   Active ROM Right eval Left eval  Hip flexion 100 100  Hip extension Lacks 10 deg 5  Hip abduction      Hip adduction      Hip internal rotation 15 15  Hip external rotation 45 45  Knee flexion   WFL  Knee extension Lacks 10 deg full  Ankle dorsiflexion      Ankle plantarflexion      Ankle inversion      Ankle eversion       (Blank rows = not tested)   LOWER EXTREMITY MMT:   MMT Right eval Left eval  Hip flexion 4-    Hip extension 4    Hip abduction  4    Hip adduction 5    Hip internal rotation 4+    Hip external rotation 3+    Knee flexion 4 4  Knee extension 4 4  Ankle dorsiflexion      Ankle plantarflexion      Ankle inversion      Ankle eversion       (Blank rows = not tested)   LOWER EXTREMITY SPECIAL TESTS:      FUNCTIONAL TESTS:  Eval 08/24/22: 5x sit to stand: 19 sec with UE use on chair TUG: 26" with SPC 3' walk test: 110' with 2-wheeled walker    GAIT: Eval 08/24/22: Distance walked: 303' (3' walk test) Assistive device utilized: Environmental consultant - 2 wheeled Level of assistance: Modified independence Comments: lacks terminal knee extension on Rt, flexed trunk over Rt hip, lacks Rt hip extension and gluteal activation     TODAY'S TREATMENT:  DATE:  11/30: NuStep L3 x 5', PT present to monitor and discuss symptoms, less resistance and time today due fatigue from return to work Seated Rt hamstring stretch 2x20" Supine Rt piriformis stretch pull across and push away 2x20" each Supine lower trunk rotation x 20 Supine bridge with LES on green ball x10 Sit to stand x7 from low mat uses light hands on thighs  Counter bil heel raise x15 Counter sidestepping x 3 laps    09/13/22: NuStep L3 x 9', PT present to monitor and discuss symptoms, less resistance today due to soreness from yesterday's session Seated Rt hamstring stretch 2x20" Supine Rt piriformis stretch pull across and push away 2x20" each Supine lower trunk rotation x 20 Supine Rt SLR 2x10 Supine bridge x10 Sit to stand 2x5 from low mat uses light hands on thighs (sets of 5 due to soreness) Counter bil heel raise x15 Counter sidestepping x 3 laps Weight shifting on trampoline x 2' square stance, 2' stagger stance Rt foot forward  09/12/22: Nustep L5 9 min with PT present to monitor. Sidelying clams 2x10 Right hip abduction  5x Bridge 10x Side stepping laps at the counter 3 laps WB on right with left step touches 2x5 (challenging) At the counter hip extension right/left 10x Sit to stand 10x from low mat uses light hands on thighs 2 inch step up 10x with railings 4 inch step up 10x with railings Floor slider right/left hip abduction with UE support  10x  Seated hamstring stretch RT 2x 20 sec     PATIENT EDUCATION:  Education details: Access Code: [email or web address] Person educated: Patient Education method: Consulting civil engineer, Demonstration, Verbal cues, and Handouts Education comprehension: verbalized understanding and returned demonstration   HOME EXERCISE PROGRAM: Access Code: [email or web address] URL: [email or web address] Date: 09/12/2022 Prepared by: [place] [employer]  - 2 x daily - 7 x weekly - 1 sets - 3 reps - 20 hold - Seated Hamstring Stretch  - 2 x daily - 7 x weekly - 1 sets - 3 reps - 20 hold - Hip Flexor Stretch with Chair  - 2 x daily - 7 x weekly - 2 sets - 10 reps - Clamshell (Mirrored)  - 1 x daily - 7 x weekly - 1 sets - 10 reps - Supine Bridge  - 1 x daily - 7 x weekly - 1 sets - 10 reps - Sit to Stand  - 1 x daily - 7 x weekly - 1 sets - 10 reps - Side Stepping with Counter Support  - 1 x daily - 7 x weekly - 1 sets - 10 reps - Standing Forward Step Taps with Counter Support  - 1 x daily - 7 x weekly - 1 sets - 10 reps ASSESSMENT:   CLINICAL IMPRESSION: Treatment modified secondary to pt with extreme fatigue 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  Her pain level has not increased but overall general fatigue is primary complaint today.  Therapist monitoring response to all interventions and modifying treatment accordingly.  Several functional improvements noted today.         ACTIVITY LIMITATIONS: lifting, bending, squatting, sleeping, stairs, and locomotion level   PARTICIPATION LIMITATIONS: meal prep, cleaning, laundry, shopping,  community activity, occupation, and yard work   PERSONAL FACTORS: Time since onset of injury/illness/exacerbation and 1-2 comorbidities: bil knee OA, Lt hip OA  are also affecting patient's functional outcome.    REHAB POTENTIAL: Good   CLINICAL DECISION MAKING:  Stable/uncomplicated   EVALUATION COMPLEXITY: Low     GOALS: Goals reviewed with patient? Yes   SHORT TERM GOALS: Target date: 09/21/2022  Pt will be ind with initial HEP without exacerbation of symptoms. Baseline: Goal status: 09/08/22: Met   2.  Improve 5x sit to stand to 14 sec or less Baseline: 19 sec Goal status: INITIAL   3.  Improve TUG to 18 sec or less with SPC Baseline: 26 sec with SPC Goal status: INITIAL       LONG TERM GOALS: Target date: 10/19/2022    Pt will be ind with advanced HEP and understand how to safely progress Baseline:  Goal status: INITIAL   2.  Pt will improve FOTO score to at least 68% to demo improved function. Baseline: 52% Goal status: INITIAL   3.  Pt will be able to sleep on Rt side for up to 2 hours before needing to change positions Baseline: up to 1 hour now Goal status: INITIAL   4.  Pt will demo symmetrical gait pattern with full Rt hip and knee extension within 3' walk test with SPC vs walker covering at least 300' Baseline: 303' with walker Goal status: INITIAL   5.  Pt will achieve at least 4+/5 strength of Rt hip and thigh to allow for improved squatting, stairs and gait Baseline:  Goal status: INITIAL   6.  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

## 2022-09-22 ENCOUNTER — Encounter: Payer: BC Managed Care – PPO | Admitting: Physical Therapy

## 2022-09-26 ENCOUNTER — Encounter: Payer: BC Managed Care – PPO | Admitting: Physical Therapy

## 2022-09-27 ENCOUNTER — Ambulatory Visit: Payer: BC Managed Care – PPO | Attending: Student | Admitting: Physical Therapy

## 2022-09-27 DIAGNOSIS — R293 Abnormal posture: Secondary | ICD-10-CM | POA: Diagnosis present

## 2022-09-27 DIAGNOSIS — M6281 Muscle weakness (generalized): Secondary | ICD-10-CM | POA: Insufficient documentation

## 2022-09-27 DIAGNOSIS — M25551 Pain in right hip: Secondary | ICD-10-CM | POA: Insufficient documentation

## 2022-09-27 NOTE — Therapy (Signed)
OUTPATIENT PHYSICAL THERAPY TREATMENT NOTE   Patient Name: Audrey Bradley MRN: [id number] DOB:November 28, 1965, 56 y.o., female Today's Date: 09/27/2022  PCP:  Lanier Prude, PA   REFERRING PROVIDER: Jearld Lesch, PA   END OF SESSION:   PT End of Session - 09/27/22 1537     Visit Number 6    Date for PT Re-Evaluation 10/19/22    Authorization Type BCBS    PT Start Time 1531    PT Stop Time 1615    PT Time Calculation (min) 44 min    Activity Tolerance Patient tolerated treatment well                   Past Medical History:  Diagnosis Date   Anxiety    Asthma    Seasonal   Hypothyroidism    Pneumonia    PONV (postoperative nausea and vomiting)    Past Surgical History:  Procedure Laterality Date   ABDOMINAL HYSTERECTOMY     APPENDECTOMY     BARIATRIC SURGERY     lapband   CHOLECYSTECTOMY     FOOT SURGERY Left    tendon repair   MASS EXCISION     left underarm   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   Procedure: TOTAL HIP ARTHROPLASTY ANTERIOR APPROACH;  Surgeon: Gaynelle Arabian, MD;  Location: [facility];  Service: Orthopedics;  Laterality: Right;   Patient Active Problem List   Diagnosis Date Noted   Osteoarthritis of right hip 06/12/2022   Unilateral primary osteoarthritis, right hip 10/07/2020   Bilateral primary osteoarthritis of hip 09/30/2020   Morbid exogenous obesity ([place]) 09/30/2020   BMI 50.0-59.9, adult [facility]) 09/30/2020   Lapband Vanguard 11cm June 2007 [place], [place], [place]) 09/12/2013    REFERRING DIAG: Z51.89 (ICD-10-CM) - Encounter for other specified aftercare    THERAPY DIAG:  Muscle weakness (generalized)  Abnormal posture  Pain in right hip  Rationale for Evaluation and Treatment Rehabilitation  PERTINENT HISTORY: PMH: anxiety, asthma, hypothyroidism   PRECAUTIONS: None  SUBJECTIVE:                                                                                                                                                                                       SUBJECTIVE STATEMENT:    I'm tired.  I'm going to buy some [employer] shoes when I leave here.  I stretch on the kidney table while the kids are at [facility].    PAIN:  PAIN:  Are you having pain? Yes NPRS scale: 3/10 Pain location: right hip   OBJECTIVE: (objective measures completed at initial evaluation unless otherwise dated)  DIAGNOSTIC FINDINGS:    PATIENT SURVEYS:  FOTO 52% goal 68%   COGNITION: Overall cognitive status: Within functional limits for tasks assessed                         SENSATION: Limited sensation Rt lateral and anterior thigh   EDEMA:      MUSCLE LENGTH: 08/24/22: Hamstrings: Right 70 deg; Left 75 deg Thomas test: Right positive 10 deg   POSTURE: flexed trunk  and lacks full knee ext on Rt, lacks neutral Rt hip positioning (flexed) in standing   PALPATION: Altered sensation along lateral and anterior Rt thigh   LOWER EXTREMITY ROM:   Active ROM Right eval Left eval 12/6  Hip flexion 100 100 Right 105  Hip extension Lacks 10 deg 5   Hip abduction       Hip adduction       Hip internal rotation 15 15   Hip external rotation 45 45 45  Knee flexion   WFL   Knee extension Lacks 10 deg full   Ankle dorsiflexion       Ankle plantarflexion       Ankle inversion       Ankle eversion        (Blank rows = not tested)   LOWER EXTREMITY MMT:   MMT Right eval Left eval 12/6  Hip flexion 4-   4  Hip extension 4   4  Hip abduction 4   4  Hip adduction 5     Hip internal rotation 4+     Hip external rotation 3+     Knee flexion _0 Knee extension _1 Ankle dorsiflexion       Ankle plantarflexion       Ankle inversion       Ankle eversion        (Blank rows = not tested)   LOWER EXTREMITY SPECIAL TESTS:      FUNCTIONAL TESTS:  Eval 08/24/22: 5x sit to stand: 19 sec with UE use on chair TUG: 26" with SPC 3' walk test: 65' with 2-wheeled walker    GAIT: Eval 08/24/22: Distance walked:  37' (3' walk test) Assistive device utilized: Environmental consultant - 2 wheeled Level of assistance: Modified independence Comments: lacks terminal knee extension on Rt, flexed trunk over Rt hip, lacks Rt hip extension and gluteal activation     TODAY'S TREATMENT:    12/6: NuStep L x 8', PT present to monitor and discuss symptoms, less resistance and time today due fatigue from return to work Bil heel raises 10x Sidestepping at the railing 3 laps Standing hip abduction 5x right/left  Quad isometric 8x 5 sec hold Seated hip abduction up and over low cone 10x Sit to stand from chair + cushion 5x no hands Seated green band clams 10x double and single Supine Rt piriformis stretch pull across and push away 2x20" each Right leg straight with left KTC to stretch hip flexors 1x Supine bridge with LES on green ball x10  DATE:  11/30: NuStep L3 x 5', PT present to monitor and discuss symptoms, less resistance and time today due fatigue from return to work Seated Rt hamstring stretch 2x20" Supine Rt piriformis stretch pull across and push away 2x20" each Supine lower trunk rotation x 20 Supine bridge with LES on green ball x10 Sit to stand x7 from low mat uses light hands on thighs  Counter bil heel raise x15 Counter sidestepping x 3 laps    09/13/22: NuStep L3 x 9', PT present to monitor and discuss symptoms, less resistance today due to soreness from yesterday's session Seated Rt hamstring stretch 2x20" Supine Rt piriformis stretch pull across and push away 2x20" each Supine lower trunk rotation x 20 Supine Rt SLR 2x10 Supine bridge x10 Sit to stand 2x5 from low mat uses light hands on thighs (sets of 5 due to soreness) Counter bil heel raise x15 Counter sidestepping x 3 laps Weight shifting on trampoline x 2' square stance, 2' stagger stance Rt foot forward     PATIENT EDUCATION:  Education details: Access Code: [email or web address] Person educated: Patient Education method: Explanation, Demonstration, Verbal cues, and Handouts Education comprehension: verbalized understanding and returned demonstration   HOME EXERCISE PROGRAM: Access Code: [email or web address] URL: [email or web address] Date: 09/12/2022 Prepared by: [place] [employer]  - 2 x daily - 7 x weekly - 1 sets - 3 reps - 20 hold - Seated Hamstring Stretch  - 2 x daily - 7 x weekly - 1 sets - 3 reps - 20 hold - Hip Flexor Stretch with Chair  - 2 x daily - 7 x weekly - 2 sets - 10 reps - Clamshell (Mirrored)  - 1 x daily - 7 x weekly - 1 sets - 10 reps - Supine Bridge  - 1 x daily - 7 x weekly - 1 sets - 10 reps - Sit to Stand  - 1 x daily - 7 x weekly - 1 sets - 10 reps - Side Stepping with Counter Support  - 1 x daily - 7 x weekly - 1 sets - 10 reps - Standing Forward Step Taps with Counter Support  - 1 x daily - 7 x weekly - 1 sets - 10 reps ASSESSMENT:   CLINICAL IMPRESSION: Progressing well with post-surgical rehab.  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  Improving hip flexor length on right.  She continues to ambulate with [facility] for medium community distances although this is challenging on her back after leaning on a walker 3 years prior to surgery.  Therapist modifying ex's based on pain and fatigue level.          ACTIVITY LIMITATIONS: lifting, bending, squatting, sleeping, stairs, and locomotion level   PARTICIPATION LIMITATIONS: meal prep, cleaning, laundry, shopping, community activity, occupation, and yard work   PERSONAL FACTORS: Time since onset of injury/illness/exacerbation and 1-2 comorbidities: bil knee OA, Lt hip OA  are also affecting patient's functional outcome.    REHAB POTENTIAL: Good   CLINICAL DECISION MAKING: Stable/uncomplicated   EVALUATION COMPLEXITY: Low     GOALS: Goals reviewed with  patient? Yes   SHORT TERM GOALS: Target date: 09/21/2022  Pt will be ind with initial HEP without exacerbation of symptoms. Baseline: Goal status: 09/08/22: Met   2.  Improve 5x sit to stand to 14 sec or less Baseline: 19 sec Goal status: INITIAL   3.  Improve TUG to 18 sec or less with SPC Baseline:  26 sec with SPC Goal status: INITIAL       LONG TERM GOALS: Target date: 10/19/2022    Pt will be ind with advanced HEP and understand how to safely progress Baseline:  Goal status: INITIAL   2.  Pt will improve FOTO score to at least 68% to demo improved function. Baseline: 52% Goal status: INITIAL   3.  Pt will be able to sleep on Rt side for up to 2 hours before needing to change positions Baseline: up to 1 hour now Goal status: INITIAL   4.  Pt will demo symmetrical gait pattern with full Rt hip and knee extension within 3' walk test with SPC vs walker covering at least 300' Baseline: 303' with walker Goal status: INITIAL   5.  Pt will achieve at least 4+/5 strength of Rt hip and thigh to allow for improved squatting, stairs and gait Baseline:  Goal status: INITIAL   6.  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

## 2022-09-28 ENCOUNTER — Ambulatory Visit: Payer: BC Managed Care – PPO | Admitting: Physical Therapy

## 2022-09-28 ENCOUNTER — Encounter: Payer: Self-pay | Admitting: Physical Therapy

## 2022-09-28 DIAGNOSIS — M25551 Pain in right hip: Secondary | ICD-10-CM

## 2022-09-28 DIAGNOSIS — M6281 Muscle weakness (generalized): Secondary | ICD-10-CM

## 2022-09-28 NOTE — Therapy (Signed)
OUTPATIENT PHYSICAL THERAPY TREATMENT NOTE   Patient Name: Audrey Bradley MRN: [id number] DOB:[birth year], 56 y.o., female Today's Date: 09/28/2022  PCP:  Lanier Prude, PA   REFERRING PROVIDER: Jearld Lesch, PA   END OF SESSION:   PT End of Session - 09/28/22 1538     Visit Number 7    Date for PT Re-Evaluation 10/19/22    Authorization Type BCBS    PT Start Time 1533    PT Stop Time 1615    PT Time Calculation (min) 42 min    Activity Tolerance Patient tolerated treatment well                   Past Medical History:  Diagnosis Date   Anxiety    Asthma    Seasonal   Hypothyroidism    Pneumonia    PONV (postoperative nausea and vomiting)    Past Surgical History:  Procedure Laterality Date   ABDOMINAL HYSTERECTOMY     APPENDECTOMY     BARIATRIC SURGERY     lapband   CHOLECYSTECTOMY     FOOT SURGERY Left    tendon repair   MASS EXCISION     left underarm   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   Procedure: TOTAL HIP ARTHROPLASTY ANTERIOR APPROACH;  Surgeon: Gaynelle Arabian, MD;  Location: [facility];  Service: Orthopedics;  Laterality: Right;   Patient Active Problem List   Diagnosis Date Noted   Osteoarthritis of right hip 06/12/2022   Unilateral primary osteoarthritis, right hip 10/07/2020   Bilateral primary osteoarthritis of hip 09/30/2020   Morbid exogenous obesity ([place]) 09/30/2020   BMI 50.0-59.9, adult [facility]) 09/30/2020   Lapband Vanguard 11cm June 2007 [place], [place], [place]) 09/12/2013    REFERRING DIAG: Z51.89 (ICD-10-CM) - Encounter for other specified aftercare    THERAPY DIAG:  Muscle weakness (generalized)  Pain in right hip  Rationale for Evaluation and Treatment Rehabilitation  PERTINENT HISTORY: PMH: anxiety, asthma, hypothyroidism   PRECAUTIONS: None  SUBJECTIVE:                                                                                                                                                                                       SUBJECTIVE STATEMENT:      Work today was exhausting.  Wearing her new [employer] shoes.   PAIN:  PAIN:  Are you having pain? Yes NPRS scale: 3/10 Pain location: right hip   OBJECTIVE: (objective measures completed at initial evaluation unless otherwise dated)  DIAGNOSTIC FINDINGS:    PATIENT SURVEYS:  FOTO 52% goal 68%   COGNITION: Overall cognitive status: Within functional limits for tasks assessed  SENSATION: Limited sensation Rt lateral and anterior thigh   EDEMA:      MUSCLE LENGTH: 08/24/22: Hamstrings: Right 70 deg; Left 75 deg Thomas test: Right positive 10 deg   POSTURE: flexed trunk  and lacks full knee ext on Rt, lacks neutral Rt hip positioning (flexed) in standing   PALPATION: Altered sensation along lateral and anterior Rt thigh   LOWER EXTREMITY ROM:   Active ROM Right eval Left eval 12/6  Hip flexion 100 100 Right 105  Hip extension Lacks 10 deg 5   Hip abduction       Hip adduction       Hip internal rotation 15 15   Hip external rotation 45 45 45  Knee flexion   WFL   Knee extension Lacks 10 deg full   Ankle dorsiflexion       Ankle plantarflexion       Ankle inversion       Ankle eversion        (Blank rows = not tested)   LOWER EXTREMITY MMT:   MMT Right eval Left eval 12/6  Hip flexion 4-   4  Hip extension 4   4  Hip abduction 4   4  Hip adduction 5     Hip internal rotation 4+     Hip external rotation 3+     Knee flexion _0 Knee extension _1 Ankle dorsiflexion       Ankle plantarflexion       Ankle inversion       Ankle eversion        (Blank rows = not tested)   LOWER EXTREMITY SPECIAL TESTS:      FUNCTIONAL TESTS:  Eval 08/24/22: 5x sit to stand: 19 sec with UE use on chair TUG: 26" with SPC 3' walk test: 42' with 2-wheeled walker    GAIT: Eval 08/24/22: Distance walked: 73' (3' walk test) Assistive device utilized: Environmental consultant - 2 wheeled Level of assistance:  Modified independence Comments: lacks terminal knee extension on Rt, flexed trunk over Rt hip, lacks Rt hip extension and gluteal activation     TODAY'S TREATMENT:   12/7: NuStep L5 new model  x 10', PT present to monitor and discuss symptoms Seated LAQ 3# 2x10 3# ankle weight up and over low cone x10 Green band HS curls with sliders 10x Seated green band clams 10x double  [employer] pew sway in front of the mat table 10x 2 inch step taps 10x  Sidestepping at the railing 3 laps WB on right, 3 LE 3 ways  3 sets of 4 reps  Sit to stand from mat + cushion 5x no hands      12/6: NuStep L x 8', PT present to monitor and discuss symptoms, less resistance and time today due fatigue from return to work Bil heel raises 10x Sidestepping at the railing 3 laps Standing hip abduction 5x right/left  Quad isometric 8x 5 sec hold Seated hip abduction up and over low cone 10x Sit to stand from chair + cushion 5x no hands Seated green band clams 10x double and single Supine Rt piriformis stretch pull across and push away 2x20" each Right leg straight with left KTC to stretch hip flexors 1x Supine bridge with LES on green ball x10  DATE:  11/30: NuStep L3 x 5', PT present to monitor and discuss symptoms, less resistance and time today due fatigue from return to work Seated Rt hamstring stretch 2x20" Supine Rt piriformis stretch pull across and push away 2x20" each Supine lower trunk rotation x 20 Supine bridge with LES on green ball x10 Sit to stand x7 from low mat uses light hands on thighs  Counter bil heel raise x15 Counter sidestepping x 3 laps      PATIENT EDUCATION:  Education details: Access Code: [email or web address] Person educated: Patient Education method: Consulting civil engineer, Demonstration, Verbal cues, and Handouts Education comprehension: verbalized understanding  and returned demonstration   HOME EXERCISE PROGRAM: Access Code: [email or web address] URL: [email or web address] Date: 09/12/2022 Prepared by: [place] [employer]  - 2 x daily - 7 x weekly - 1 sets - 3 reps - 20 hold - Seated Hamstring Stretch  - 2 x daily - 7 x weekly - 1 sets - 3 reps - 20 hold - Hip Flexor Stretch with Chair  - 2 x daily - 7 x weekly - 2 sets - 10 reps - Clamshell (Mirrored)  - 1 x daily - 7 x weekly - 1 sets - 10 reps - Supine Bridge  - 1 x daily - 7 x weekly - 1 sets - 10 reps - Sit to Stand  - 1 x daily - 7 x weekly - 1 sets - 10 reps - Side Stepping with Counter Support  - 1 x daily - 7 x weekly - 1 sets - 10 reps - Standing Forward Step Taps with Counter Support  - 1 x daily - 7 x weekly - 1 sets - 10 reps ASSESSMENT:   CLINICAL IMPRESSION: Progressing well s/p THA.  In addition to the impacts of surgery, the patient was on a walker for years prior to surgery which contributed to profound strength loss in right LE.  She may require a longer course of rehab than typical.  Full weight bearing on right is limited secondary to glute weakness and quick muscular fatigue.  Therapist adjusting ex intensity based on response.          ACTIVITY LIMITATIONS: lifting, bending, squatting, sleeping, stairs, and locomotion level   PARTICIPATION LIMITATIONS: meal prep, cleaning, laundry, shopping, community activity, occupation, and yard work   PERSONAL FACTORS: Time since onset of injury/illness/exacerbation and 1-2 comorbidities: bil knee OA, Lt hip OA  are also affecting patient's functional outcome.    REHAB POTENTIAL: Good   CLINICAL DECISION MAKING: Stable/uncomplicated   EVALUATION COMPLEXITY: Low     GOALS: Goals reviewed with patient? Yes   SHORT TERM GOALS: Target date: 09/21/2022  Pt will be ind with initial HEP without exacerbation of symptoms. Baseline: Goal status: 09/08/22: Met   2.  Improve 5x sit to stand to 14  sec or less Baseline: 19 sec Goal status: INITIAL   3.  Improve TUG to 18 sec or less with SPC Baseline: 26 sec with SPC Goal status: INITIAL       LONG TERM GOALS: Target date: 10/19/2022    Pt will be ind with advanced HEP and understand how to safely progress Baseline:  Goal status: INITIAL   2.  Pt will improve FOTO score to at least 68% to demo improved function. Baseline: 52% Goal status: INITIAL   3.  Pt will be able to sleep on Rt side for up to 2 hours before needing to change positions Baseline: up to 1  hour now Goal status: INITIAL   4.  Pt will demo symmetrical gait pattern with full Rt hip and knee extension within 3' walk test with SPC vs walker covering at least 300' Baseline: 303' with walker Goal status: INITIAL   5.  Pt will achieve at least 4+/5 strength of Rt hip and thigh to allow for improved squatting, stairs and gait Baseline:  Goal status: INITIAL   6.  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

## 2022-09-29 ENCOUNTER — Encounter: Payer: BC Managed Care – PPO | Admitting: Physical Therapy

## 2022-10-03 ENCOUNTER — Ambulatory Visit: Payer: BC Managed Care – PPO | Admitting: Physical Therapy

## 2022-10-03 DIAGNOSIS — M6281 Muscle weakness (generalized): Secondary | ICD-10-CM

## 2022-10-03 DIAGNOSIS — M25551 Pain in right hip: Secondary | ICD-10-CM

## 2022-10-03 NOTE — Therapy (Signed)
OUTPATIENT PHYSICAL THERAPY TREATMENT NOTE   Patient Name: Audrey Bradley MRN: [id number] DOB:10/08/1966, 56 y.o., female Today's Date: 10/03/2022  PCP:  Lanier Prude, PA   REFERRING PROVIDER: Jearld Lesch, PA   END OF SESSION:   PT End of Session - 10/03/22 1529     Visit Number 8    Date for PT Re-Evaluation 10/19/22    Authorization Type BCBS    PT Start Time 1530    PT Stop Time 1614    PT Time Calculation (min) 44 min    Activity Tolerance Patient tolerated treatment well                   Past Medical History:  Diagnosis Date   Anxiety    Asthma    Seasonal   Hypothyroidism    Pneumonia    PONV (postoperative nausea and vomiting)    Past Surgical History:  Procedure Laterality Date   ABDOMINAL HYSTERECTOMY     APPENDECTOMY     BARIATRIC SURGERY     lapband   CHOLECYSTECTOMY     FOOT SURGERY Left    tendon repair   MASS EXCISION     left underarm   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   Procedure: TOTAL HIP ARTHROPLASTY ANTERIOR APPROACH;  Surgeon: Gaynelle Arabian, MD;  Location: [facility];  Service: Orthopedics;  Laterality: Right;   Patient Active Problem List   Diagnosis Date Noted   Osteoarthritis of right hip 06/12/2022   Unilateral primary osteoarthritis, right hip 10/07/2020   Bilateral primary osteoarthritis of hip 09/30/2020   Morbid exogenous obesity ([place]) 09/30/2020   BMI 50.0-59.9, adult [facility]) 09/30/2020   Lapband Vanguard 11cm June 2007 [place], [place], [place]) 09/12/2013    REFERRING DIAG: Z51.89 (ICD-10-CM) - Encounter for other specified aftercare    THERAPY DIAG:  Muscle weakness (generalized)  Pain in right hip  Rationale for Evaluation and Treatment Rehabilitation  PERTINENT HISTORY: PMH: anxiety, asthma, hypothyroidism   PRECAUTIONS: None  SUBJECTIVE:                                                                                                                                                                                       SUBJECTIVE STATEMENT:    2 days in a row of PT is rough.  I didn't sit down at all today.  Tweaked my knee walking too fast to get to the bathroom.  Did 4 loads of laundry and made a lasagna over the weekend.       PAIN:  PAIN:  Are you having pain? Yes NPRS scale: 3/10 Pain location: right hip   OBJECTIVE: (objective measures completed  at initial evaluation unless otherwise dated)  DIAGNOSTIC FINDINGS:    PATIENT SURVEYS:  FOTO 52% goal 68%   COGNITION: Overall cognitive status: Within functional limits for tasks assessed                         SENSATION: Limited sensation Rt lateral and anterior thigh   EDEMA:      MUSCLE LENGTH: 08/24/22: Hamstrings: Right 70 deg; Left 75 deg Thomas test: Right positive 10 deg   POSTURE: flexed trunk  and lacks full knee ext on Rt, lacks neutral Rt hip positioning (flexed) in standing   PALPATION: Altered sensation along lateral and anterior Rt thigh   LOWER EXTREMITY ROM:   Active ROM Right eval Left eval 12/6  Hip flexion 100 100 Right 105  Hip extension Lacks 10 deg 5   Hip abduction       Hip adduction       Hip internal rotation 15 15   Hip external rotation 45 45 45  Knee flexion   WFL   Knee extension Lacks 10 deg full   Ankle dorsiflexion       Ankle plantarflexion       Ankle inversion       Ankle eversion        (Blank rows = not tested)   LOWER EXTREMITY MMT:   MMT Right eval Left eval 12/6  Hip flexion 4-   4  Hip extension 4   4  Hip abduction 4   4  Hip adduction 5     Hip internal rotation 4+     Hip external rotation 3+     Knee flexion _0 Knee extension _1 Ankle dorsiflexion       Ankle plantarflexion       Ankle inversion       Ankle eversion        (Blank rows = not tested)   LOWER EXTREMITY SPECIAL TESTS:      FUNCTIONAL TESTS:  Eval 08/24/22: 5x sit to stand: 19 sec with UE use on chair TUG: 26" with SPC 3' walk test: 18' with 2-wheeled walker     12/12: 5x sit to stand 17.96 knee pain with UE use on thighs TUG with cane 19.96 with UE use on chair   GAIT: Eval 08/24/22: Distance walked: 303' (3' walk test) Assistive device utilized: Environmental consultant - 2 wheeled Level of assistance: Modified independence Comments: lacks terminal knee extension on Rt, flexed trunk over Rt hip, lacks Rt hip extension and gluteal activation     TODAY'S TREATMENT:   12/12: NuStep L5 new model  x 10', PT present to monitor and discuss symptoms TUG with cane   5x sit to stand test 3# ankle weight up and over low cone x12 Seated green band clams 10x double; right only 10x 2 inch step taps 10x  Sidestepping at the railing 3 laps WB on right,  LE 3 ways  at the railing to circles 2 sets: of 6 reps, 7 reps Seated HS curls green band 15x Seated ankle PF green band 20x Seated green band right single leg press out stomp on the brake 10x       12/7: NuStep L5 new model  x 10', PT present to monitor and discuss symptoms Seated LAQ 3# 2x10 3# ankle weight up and over low cone x10 Green band HS curls with sliders 10x Seated green band clams 10x  double  Church pew sway in front of the mat table 10x 2 inch step taps 10x  Sidestepping at the railing 3 laps WB on right, 3 LE 3 ways  3 sets of 4 reps  Sit to stand from mat + cushion 5x no hands      12/6: NuStep L x 8', PT present to monitor and discuss symptoms, less resistance and time today due fatigue from return to work Bil heel raises 10x Sidestepping at the railing 3 laps Standing hip abduction 5x right/left  Quad isometric 8x 5 sec hold Seated hip abduction up and over low cone 10x Sit to stand from chair + cushion 5x no hands Seated green band clams 10x double and single Supine Rt piriformis stretch pull across and push away 2x20" each Right leg straight with left KTC to stretch hip flexors 1x Supine bridge with LES on green ball x10        PATIENT EDUCATION:  Education details:  Access Code: [email or web address] Person educated: Patient Education method: Consulting civil engineer, Media planner, Verbal cues, and Handouts Education comprehension: verbalized understanding and returned demonstration   HOME EXERCISE PROGRAM: Access Code: [email or web address] URL: [email or web address] Date: 09/12/2022 Prepared by: [place] [employer]  - 2 x daily - 7 x weekly - 1 sets - 3 reps - 20 hold - Seated Hamstring Stretch  - 2 x daily - 7 x weekly - 1 sets - 3 reps - 20 hold - Hip Flexor Stretch with Chair  - 2 x daily - 7 x weekly - 2 sets - 10 reps - Clamshell (Mirrored)  - 1 x daily - 7 x weekly - 1 sets - 10 reps - Supine Bridge  - 1 x daily - 7 x weekly - 1 sets - 10 reps - Sit to Stand  - 1 x daily - 7 x weekly - 1 sets - 10 reps - Side Stepping with Counter Support  - 1 x daily - 7 x weekly - 1 sets - 10 reps - Standing Forward Step Taps with Counter Support  - 1 x daily - 7 x weekly - 1 sets - 10 reps ASSESSMENT:   CLINICAL IMPRESSION: Good progress with TUG and 5x sit to stand test times with decreased use of hands on chair to rise.  All STGS met. Improving tolerance of WB on right although given that she was on a walker for 2 years prior to surgery, a longer length of time will be needed to strengthen hip musculature.  Treatment modified secondary to general fatigue after extensive time standing at work today.        ACTIVITY LIMITATIONS: lifting, bending, squatting, sleeping, stairs, and locomotion level   PARTICIPATION LIMITATIONS: meal prep, cleaning, laundry, shopping, community activity, occupation, and yard work   PERSONAL FACTORS: Time since onset of injury/illness/exacerbation and 1-2 comorbidities: bil knee OA, Lt hip OA  are also affecting patient's functional outcome.    REHAB POTENTIAL: Good   CLINICAL DECISION MAKING: Stable/uncomplicated   EVALUATION COMPLEXITY: Low     GOALS: Goals reviewed with patient? Yes   SHORT TERM GOALS:  Target date: 09/21/2022  Pt will be ind with initial HEP without exacerbation of symptoms. Baseline: Goal status: 09/08/22: Met   2.  Improve 5x sit to stand to 14 sec or less Baseline: 19 sec Goal status: goal met 12/12   3.  Improve TUG to 18 sec or less with SPC Baseline: 26 sec with SPC Goal status: goal  met 12/12       LONG TERM GOALS: Target date: 10/19/2022    Pt will be ind with advanced HEP and understand how to safely progress Baseline:  Goal status: INITIAL   2.  Pt will improve FOTO score to at least 68% to demo improved function. Baseline: 52% Goal status: INITIAL   3.  Pt will be able to sleep on Rt side for up to 2 hours before needing to change positions Baseline: up to 1 hour now Goal status: INITIAL   4.  Pt will demo symmetrical gait pattern with full Rt hip and knee extension within 3' walk test with SPC vs walker covering at least 300' Baseline: 303' with walker Goal status: INITIAL   5.  Pt will achieve at least 4+/5 strength of Rt hip and thigh to allow for improved squatting, stairs and gait Baseline:  Goal status: INITIAL   6.  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 Baseline:  Goal status: INITIAL     PLAN:   PT FREQUENCY: 2x/week   PT DURATION: 8 weeks   PLANNED INTERVENTIONS: Therapeutic exercises, Therapeutic activity, Neuromuscular re-education, Balance training, Gait training, Patient/Family education, Self Care, Joint mobilization, Stair training, Aquatic Therapy, Dry Needling, Electrical stimulation, Spinal mobilization, Cryotherapy, Moist heat, Taping, Vasopneumatic device, Ionotophoresis 57m/ml Dexamethasone, and Manual therapy   PLAN FOR NEXT SESSION: 3 min walk test; step ups; Nustep, RT hip strength and ROM; pt has returned to work as a [place]ural [employer]12/12/23 4:11 PM Phone: [phone]: [phone]

## 2022-10-04 ENCOUNTER — Ambulatory Visit: Payer: BC Managed Care – PPO | Admitting: Physical Therapy

## 2022-10-10 ENCOUNTER — Encounter: Payer: BC Managed Care – PPO | Admitting: Physical Therapy

## 2022-10-11 ENCOUNTER — Ambulatory Visit: Payer: BC Managed Care – PPO | Admitting: Physical Therapy

## 2022-10-11 DIAGNOSIS — M6281 Muscle weakness (generalized): Secondary | ICD-10-CM

## 2022-10-11 DIAGNOSIS — M25551 Pain in right hip: Secondary | ICD-10-CM

## 2022-10-11 NOTE — Therapy (Signed)
OUTPATIENT PHYSICAL THERAPY TREATMENT NOTE   Patient Name: Audrey Bradley MRN: [id number] DOB:Apr 21, 1966, 56 y.o., female Today's Date: 10/11/2022  PCP:  Lanier Prude, PA   REFERRING PROVIDER: Jearld Lesch, PA   END OF SESSION:   PT End of Session - 10/11/22 1537     Visit Number 9    Date for PT Re-Evaluation 10/19/22    Authorization Type BCBS    PT Start Time 1537    PT Stop Time 9024    PT Time Calculation (min) 38 min    Activity Tolerance Patient tolerated treatment well                   Past Medical History:  Diagnosis Date   Anxiety    Asthma    Seasonal   Hypothyroidism    Pneumonia    PONV (postoperative nausea and vomiting)    Past Surgical History:  Procedure Laterality Date   ABDOMINAL HYSTERECTOMY     APPENDECTOMY     BARIATRIC SURGERY     lapband   CHOLECYSTECTOMY     FOOT SURGERY Left    tendon repair   MASS EXCISION     left underarm   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   Procedure: TOTAL HIP ARTHROPLASTY ANTERIOR APPROACH;  Surgeon: Gaynelle Arabian, MD;  Location: [facility];  Service: Orthopedics;  Laterality: Right;   Patient Active Problem List   Diagnosis Date Noted   Osteoarthritis of right hip 06/12/2022   Unilateral primary osteoarthritis, right hip 10/07/2020   Bilateral primary osteoarthritis of hip 09/30/2020   Morbid exogenous obesity ([place]) 09/30/2020   BMI 50.0-59.9, adult [facility]) 09/30/2020   Lapband Vanguard 11cm June 2007 [place], [place], [place]) 09/12/2013    REFERRING DIAG: Z51.89 (ICD-10-CM) - Encounter for other specified aftercare    THERAPY DIAG:  Muscle weakness (generalized)  Pain in right hip  Rationale for Evaluation and Treatment Rehabilitation  PERTINENT HISTORY: PMH: anxiety, asthma, hypothyroidism   PRECAUTIONS: None  SUBJECTIVE:                                                                                                                                                                                       SUBJECTIVE STATEMENT:    I'm tired. I walk all day long at school.  I have another abscess so I still can't go to the pool.  I was sick last week, fever and run down.  Sees the surgeon on Friday. April 30th flying to [place]:  "I want to go without my cane."  PAIN:  PAIN:  Are you having pain? Yes NPRS scale: 0/10  just tired, not really  hurting Pain location: right hip   OBJECTIVE: (objective measures completed at initial evaluation unless otherwise dated)  DIAGNOSTIC FINDINGS:    PATIENT SURVEYS:  FOTO 52% goal 68%   COGNITION: Overall cognitive status: Within functional limits for tasks assessed                         SENSATION: Limited sensation Rt lateral and anterior thigh   EDEMA:      MUSCLE LENGTH: 08/24/22: Hamstrings: Right 70 deg; Left 75 deg Thomas test: Right positive 10 deg   POSTURE: flexed trunk  and lacks full knee ext on Rt, lacks neutral Rt hip positioning (flexed) in standing   PALPATION: Altered sensation along lateral and anterior Rt thigh   LOWER EXTREMITY ROM:   Active ROM Right eval Left eval 12/6  Hip flexion 100 100 Right 105  Hip extension Lacks 10 deg 5   Hip abduction       Hip adduction       Hip internal rotation 15 15   Hip external rotation 45 45 45  Knee flexion   WFL   Knee extension Lacks 10 deg full   Ankle dorsiflexion       Ankle plantarflexion       Ankle inversion       Ankle eversion        (Blank rows = not tested)   LOWER EXTREMITY MMT:   MMT Right eval Left eval 12/6  Hip flexion 4-   4  Hip extension 4   4  Hip abduction 4   4  Hip adduction 5     Hip internal rotation 4+     Hip external rotation 3+     Knee flexion _0 Knee extension _1 Ankle dorsiflexion       Ankle plantarflexion       Ankle inversion       Ankle eversion        (Blank rows = not tested)   LOWER EXTREMITY SPECIAL TESTS:      FUNCTIONAL TESTS:  Eval 08/24/22: 5x sit to stand: 19 sec with UE use  on chair TUG: 26" with SPC 3' walk test: 68' with 2-wheeled walker    12/12: 5x sit to stand 17.96 knee pain with UE use on thighs TUG with cane 19.96 with UE use on chair   GAIT: Eval 08/24/22: Distance walked: 303' (3' walk test) Assistive device utilized: Environmental consultant - 2 wheeled Level of assistance: Modified independence Comments: lacks terminal knee extension on Rt, flexed trunk over Rt hip, lacks Rt hip extension and gluteal activation     TODAY'S TREATMENT:   12/20: NuStep L1 old model  x 10', PT present to monitor and discuss symptoms 4# ankle weight up and over low cone x12 Seated green band clams 10x double; single leg 10x each side Sit to stand from mat table 5x no hands 2 inch step taps left, WB on right 10x  Purple X-heavy loop hip press forward (held by PT) 10x Sidestepping at the railing 3 laps WB on right,  LE 3 ways  at the railing to circles 12x 2 inch right lateral step ups 10x Seated green band right single leg press out stomp on the brake 10x 2        12/12: NuStep L5 new model  x 10', PT present to monitor and discuss symptoms TUG with cane   5x sit to  stand test 3# ankle weight up and over low cone x12 Seated green band clams 10x double; right only 10x 2 inch step taps 10x  Sidestepping at the railing 3 laps WB on right,  LE 3 ways  at the railing to circles 2 sets: of 6 reps, 7 reps Seated HS curls green band 15x Seated ankle PF green band 20x Seated green band right single leg press out stomp on the brake 10x       12/7: NuStep L5 new model  x 10', PT present to monitor and discuss symptoms Seated LAQ 3# 2x10 3# ankle weight up and over low cone x10 Green band HS curls with sliders 10x Seated green band clams 10x double  Church pew sway in front of the mat table 10x 2 inch step taps 10x  Sidestepping at the railing 3 laps WB on right, 3 LE 3 ways  3 sets of 4 reps  Sit to stand from mat + cushion 5x no hands            PATIENT EDUCATION:  Education details: Access Code: [email or web address] Person educated: Patient Education method: Consulting civil engineer, Demonstration, Verbal cues, and Handouts Education comprehension: verbalized understanding and returned demonstration   HOME EXERCISE PROGRAM: Access Code: [email or web address] URL: [email or web address] Date: 09/12/2022 Prepared by: [place] [employer]  - 2 x daily - 7 x weekly - 1 sets - 3 reps - 20 hold - Seated Hamstring Stretch  - 2 x daily - 7 x weekly - 1 sets - 3 reps - 20 hold - Hip Flexor Stretch with Chair  - 2 x daily - 7 x weekly - 2 sets - 10 reps - Clamshell (Mirrored)  - 1 x daily - 7 x weekly - 1 sets - 10 reps - Supine Bridge  - 1 x daily - 7 x weekly - 1 sets - 10 reps - Sit to Stand  - 1 x daily - 7 x weekly - 1 sets - 10 reps - Side Stepping with Counter Support  - 1 x daily - 7 x weekly - 1 sets - 10 reps - Standing Forward Step Taps with Counter Support  - 1 x daily - 7 x weekly - 1 sets - 10 reps ASSESSMENT:   CLINICAL IMPRESSION: Improving standing tolerance and weight bearing on right LE.  She is able to take 3 steps next to the railing without support.  Muscular fatigue has been a bigger factor vs pain.  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       ACTIVITY LIMITATIONS: lifting, bending, squatting, sleeping, stairs, and locomotion level   PARTICIPATION LIMITATIONS: meal prep, cleaning, laundry, shopping, community activity, occupation, and yard work   PERSONAL FACTORS: Time since onset of injury/illness/exacerbation and 1-2 comorbidities: bil knee OA, Lt hip OA  are also affecting patient's functional outcome.    REHAB POTENTIAL: Good   CLINICAL DECISION MAKING: Stable/uncomplicated   EVALUATION COMPLEXITY: Low     GOALS: Goals reviewed with patient? Yes   SHORT TERM GOALS: Target date: 09/21/2022  Pt will be ind with initial  HEP without exacerbation of symptoms. Baseline: Goal status: 09/08/22: Met   2.  Improve 5x sit to stand to 14 sec or less Baseline: 19 sec Goal status: goal met 12/12   3.  Improve TUG to 18 sec or less with SPC Baseline: 26 sec with SPC Goal status:  goal met 12/12       LONG TERM GOALS: Target date: 10/19/2022    Pt will be ind with advanced HEP and understand how to safely progress Baseline:  Goal status: INITIAL   2.  Pt will improve FOTO score to at least 68% to demo improved function. Baseline: 52% Goal status: INITIAL   3.  Pt will be able to sleep on Rt side for up to 2 hours before needing to change positions Baseline: up to 1 hour now Goal status: INITIAL   4.  Pt will demo symmetrical gait pattern with full Rt hip and knee extension within 3' walk test with SPC vs walker covering at least 300' Baseline: 303' with walker Goal status: INITIAL   5.  Pt will achieve at least 4+/5 strength of Rt hip and thigh to allow for improved squatting, stairs and gait Baseline:  Goal status: INITIAL   6.  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

## 2022-10-12 ENCOUNTER — Ambulatory Visit: Payer: BC Managed Care – PPO | Admitting: Physical Therapy

## 2022-10-12 DIAGNOSIS — M6281 Muscle weakness (generalized): Secondary | ICD-10-CM

## 2022-10-12 DIAGNOSIS — R293 Abnormal posture: Secondary | ICD-10-CM

## 2022-10-12 DIAGNOSIS — M25551 Pain in right hip: Secondary | ICD-10-CM

## 2022-10-12 NOTE — Therapy (Signed)
OUTPATIENT PHYSICAL THERAPY TREATMENT NOTE   Patient Name: Audrey Bradley MRN: [id number] DOB:[birth year], 56 y.o., female Today's Date: 10/12/2022  PCP:  Lanier Prude, PA   REFERRING PROVIDER: Jearld Lesch, PA   END OF SESSION:   PT End of Session - 10/12/22 1532     Visit Number 10    Date for PT Re-Evaluation 10/19/22    Authorization Type BCBS    PT Start Time 1531    PT Stop Time 1093    PT Time Calculation (min) 44 min    Activity Tolerance Patient tolerated treatment well                   Past Medical History:  Diagnosis Date   Anxiety    Asthma    Seasonal   Hypothyroidism    Pneumonia    PONV (postoperative nausea and vomiting)    Past Surgical History:  Procedure Laterality Date   ABDOMINAL HYSTERECTOMY     APPENDECTOMY     BARIATRIC SURGERY     lapband   CHOLECYSTECTOMY     FOOT SURGERY Left    tendon repair   MASS EXCISION     left underarm   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   Procedure: TOTAL HIP ARTHROPLASTY ANTERIOR APPROACH;  Surgeon: Gaynelle Arabian, MD;  Location: [facility];  Service: Orthopedics;  Laterality: Right;   Patient Active Problem List   Diagnosis Date Noted   Osteoarthritis of right hip 06/12/2022   Unilateral primary osteoarthritis, right hip 10/07/2020   Bilateral primary osteoarthritis of hip 09/30/2020   Morbid exogenous obesity ([place]) 09/30/2020   BMI 50.0-59.9, adult [facility]) 09/30/2020   Lapband Vanguard 11cm June 2007 [place], [place], [place]) 09/12/2013    REFERRING DIAG: Z51.89 (ICD-10-CM) - Encounter for other specified aftercare    THERAPY DIAG:  Muscle weakness (generalized)  Pain in right hip  Abnormal posture  Rationale for Evaluation and Treatment Rehabilitation  PERTINENT HISTORY: PMH: anxiety, asthma, hypothyroidism   PRECAUTIONS: None  SUBJECTIVE:                                                                                                                                                                                       SUBJECTIVE STATEMENT:    I'm so tired.  Sees the doctor tomorrow.  During the night when I get up to go the bathroom I do my leg lifts and I walk the halls.    April 30th flying to [place]:  "I want to go without my cane."  PAIN:  PAIN:  Are you having pain? Yes NPRS scale: 0/10  just tired, not really hurting Pain  location: right hip   OBJECTIVE: (objective measures completed at initial evaluation unless otherwise dated)  DIAGNOSTIC FINDINGS:    PATIENT SURVEYS:  FOTO 52% goal 68%   COGNITION: Overall cognitive status: Within functional limits for tasks assessed                         SENSATION: Limited sensation Rt lateral and anterior thigh   EDEMA:      MUSCLE LENGTH: 08/24/22: Hamstrings: Right 70 deg; Left 75 deg Thomas test: Right positive 10 deg   POSTURE: flexed trunk  and lacks full knee ext on Rt, lacks neutral Rt hip positioning (flexed) in standing   PALPATION: Altered sensation along lateral and anterior Rt thigh   LOWER EXTREMITY ROM:   Active ROM Right eval Left eval 12/6  Hip flexion 100 100 Right 105  Hip extension Lacks 10 deg 5   Hip abduction       Hip adduction       Hip internal rotation 15 15   Hip external rotation 45 45 45  Knee flexion   WFL   Knee extension Lacks 10 deg full   Ankle dorsiflexion       Ankle plantarflexion       Ankle inversion       Ankle eversion        (Blank rows = not tested)   LOWER EXTREMITY MMT:   MMT Right eval Left eval 12/6  Hip flexion 4-   4  Hip extension 4   4  Hip abduction 4   4  Hip adduction 5     Hip internal rotation 4+     Hip external rotation 3+     Knee flexion _0 Knee extension _1 Ankle dorsiflexion       Ankle plantarflexion       Ankle inversion       Ankle eversion        (Blank rows = not tested)   LOWER EXTREMITY SPECIAL TESTS:      FUNCTIONAL TESTS:  Eval 08/24/22: 5x sit to stand: 19 sec with UE use on chair TUG:  26" with SPC 3' walk test: 9' with 2-wheeled walker    12/12: 5x sit to stand 17.96 knee pain with UE use on thighs TUG with cane 19.96 with UE use on chair   GAIT: Eval 08/24/22: Distance walked: 303' (3' walk test) Assistive device utilized: Environmental consultant - 2 wheeled Level of assistance: Modified independence Comments: lacks terminal knee extension on Rt, flexed trunk over Rt hip, lacks Rt hip extension and gluteal activation     TODAY'S TREATMENT:   12/21: NuStep L3 new model  x 10', PT present to monitor and discuss symptoms 2 inch alternating step taps 10x  2 inch forward step ups 15x right  Seated hip flexion/abduction to 4 inch step to the side holding 4# on knee 2 inch right lateral step ups 10x Walking 5 steps next to the railing x3  Sidestepping at the railing 3 laps Seated green band clams 10x double; single leg 10x each side Seated Fitter blue cord 30x Purple X-heavy loop hip press/thrust forward (held by PT) 15x WB on right,  LE 3 ways  at the railing to circles 12x      12/20: NuStep L1 old model  x 10', PT present to monitor and discuss symptoms 4# ankle weight up and over low cone x12 Seated green  band clams 10x double; single leg 10x each side Sit to stand from mat table 5x no hands 2 inch step taps left, WB on right 10x  Purple X-heavy loop hip press forward (held by PT) 10x Sidestepping at the railing 3 laps WB on right,  LE 3 ways  at the railing to circles 12x 2 inch right lateral step ups 10x Seated green band right single leg press out stomp on the brake 10x 2        12/12: NuStep L5 new model  x 10', PT present to monitor and discuss symptoms TUG with cane   5x sit to stand test 3# ankle weight up and over low cone x12 Seated green band clams 10x double; right only 10x 2 inch step taps 10x  Sidestepping at the railing 3 laps WB on right,  LE 3 ways  at the railing to circles 2 sets: of 6 reps, 7 reps Seated HS curls green band 15x Seated  ankle PF green band 20x Seated green band right single leg press out stomp on the brake 10x     PATIENT EDUCATION:  Education details: Access Code: [email or web address] Person educated: Patient Education method: Consulting civil engineer, Demonstration, Verbal cues, and Handouts Education comprehension: verbalized understanding and returned demonstration   HOME EXERCISE PROGRAM: Access Code: [email or web address] URL: [email or web address] Date: 09/12/2022 Prepared by: [place]  - 2 x daily - 7 x weekly - 1 sets - 3 reps - 20 hold - Seated Hamstring Stretch  - 2 x daily - 7 x weekly - 1 sets - 3 reps - 20 hold - Hip Flexor Stretch with Chair  - 2 x daily - 7 x weekly - 2 sets - 10 reps - Clamshell (Mirrored)  - 1 x daily - 7 x weekly - 1 sets - 10 reps - Supine Bridge  - 1 x daily - 7 x weekly - 1 sets - 10 reps - Sit to Stand  - 1 x daily - 7 x weekly - 1 sets - 10 reps - Side Stepping with Counter Support  - 1 x daily - 7 x weekly - 1 sets - 10 reps - Standing Forward Step Taps with Counter Support  - 1 x daily - 7 x weekly - 1 sets - 10 reps ASSESSMENT:   CLINICAL IMPRESSION: Able to tolerate 15 sec of full weight bearing on right before glutes become very fatigued.  Trendelenberg gait with ambulation without the cane.  Therapist modifying position of ex based of fatigue level.        ACTIVITY LIMITATIONS: lifting, bending, squatting, sleeping, stairs, and locomotion level   PARTICIPATION LIMITATIONS: meal prep, cleaning, laundry, shopping, community activity, occupation, and yard work   PERSONAL FACTORS: Time since onset of injury/illness/exacerbation and 1-2 comorbidities: bil knee OA, Lt hip OA  are also affecting patient's functional outcome.    REHAB POTENTIAL: Good   CLINICAL DECISION MAKING: Stable/uncomplicated   EVALUATION COMPLEXITY: Low     GOALS: Goals reviewed with patient? Yes   SHORT TERM GOALS: Target date: 09/21/2022  Pt will be  ind with initial HEP without exacerbation of symptoms. Baseline: Goal status: 09/08/22: Met   2.  Improve 5x sit to stand to 14 sec or less Baseline: 19 sec Goal status: goal met 12/12   3.  Improve TUG to 18 sec or less with SPC Baseline: 26 sec with SPC Goal status: goal met 12/12  LONG TERM GOALS: Target date: 10/19/2022    Pt will be ind with advanced HEP and understand how to safely progress Baseline:  Goal status: INITIAL   2.  Pt will improve FOTO score to at least 68% to demo improved function. Baseline: 52% Goal status: INITIAL   3.  Pt will be able to sleep on Rt side for up to 2 hours before needing to change positions Baseline: up to 1 hour now Goal status: INITIAL   4.  Pt will demo symmetrical gait pattern with full Rt hip and knee extension within 3' walk test with SPC vs walker covering at least 300' Baseline: 303' with walker Goal status: INITIAL   5.  Pt will achieve at least 4+/5 strength of Rt hip and thigh to allow for improved squatting, stairs and gait Baseline:  Goal status: INITIAL   6.  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 Baseline:  Goal status: INITIAL     PLAN:   PT FREQUENCY: 2x/week   PT DURATION: 8 weeks   PLANNED INTERVENTIONS: Therapeutic exercises, Therapeutic activity, Neuromuscular re-education, Balance training, Gait training, Patient/Family education, Self Care, Joint mobilization, Stair training, Aquatic Therapy, Dry Needling, Electrical stimulation, Spinal mobilization, Cryotherapy, Moist heat, Taping, Vasopneumatic device, Ionotophoresis 36m/ml Dexamethasone, and Manual therapy   PLAN FOR NEXT SESSION: see how appt went with surgeon; ERO;  FOTO; 3 min walk test;  try walking in // bars; 2 inch forward step ups; Nustep, RT hip strength and ROM; 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

## 2022-10-17 ENCOUNTER — Ambulatory Visit: Payer: BC Managed Care – PPO | Admitting: Physical Therapy

## 2022-10-17 DIAGNOSIS — M6281 Muscle weakness (generalized): Secondary | ICD-10-CM | POA: Diagnosis not present

## 2022-10-17 DIAGNOSIS — M25551 Pain in right hip: Secondary | ICD-10-CM

## 2022-10-17 NOTE — Therapy (Signed)
OUTPATIENT PHYSICAL THERAPY TREATMENT NOTE   Patient Name: Audrey Bradley MRN: [id number] DOB:1966/03/31, 56 y.o., female Today's Date: 10/12/2022  PCP:  Lanier Prude, PA   REFERRING PROVIDER: Jearld Lesch, PA   END OF SESSION:   PT End of Session - 10/17/22 0935     Visit Number 11    Date for PT Re-Evaluation 10/19/22    Authorization Type BCBS    PT Start Time 0935    PT Stop Time 7169    PT Time Calculation (min) 39 min    Activity Tolerance Patient tolerated treatment well                     Past Medical History:  Diagnosis Date   Anxiety    Asthma    Seasonal   Hypothyroidism    Pneumonia    PONV (postoperative nausea and vomiting)    Past Surgical History:  Procedure Laterality Date   ABDOMINAL HYSTERECTOMY     APPENDECTOMY     BARIATRIC SURGERY     lapband   CHOLECYSTECTOMY     FOOT SURGERY Left    tendon repair   MASS EXCISION     left underarm   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   Procedure: TOTAL HIP ARTHROPLASTY ANTERIOR APPROACH;  Surgeon: Gaynelle Arabian, MD;  Location: [facility];  Service: Orthopedics;  Laterality: Right;   Patient Active Problem List   Diagnosis Date Noted   Osteoarthritis of right hip 06/12/2022   Unilateral primary osteoarthritis, right hip 10/07/2020   Bilateral primary osteoarthritis of hip 09/30/2020   Morbid exogenous obesity ([place]) 09/30/2020   BMI 50.0-59.9, adult [facility]) 09/30/2020   Lapband Vanguard 11cm June 2007 [place], [place], [place]) 09/12/2013    REFERRING DIAG: Z51.89 (ICD-10-CM) - Encounter for other specified aftercare    THERAPY DIAG:  Muscle weakness (generalized)  Pain in right hip  Abnormal posture  Rationale for Evaluation and Treatment Rehabilitation  PERTINENT HISTORY: PMH: anxiety, asthma, hypothyroidism   PRECAUTIONS: None  SUBJECTIVE:                                                                                                                                                                                       SUBJECTIVE STATEMENT:    I don't see the doctor again for a year but I see the PA in January.  I have to wait 2 more weeks to get in the pool b/c of the abscess.  I did cooking and vacumning without my cane.    April 30th flying to [place]:  "I want to go without my cane."  PAIN:  PAIN:  Are  you having pain? Yes NPRS scale: 0/10  "I feel fine." Pain location: right hip   OBJECTIVE: (objective measures completed at initial evaluation unless otherwise dated)  DIAGNOSTIC FINDINGS:    PATIENT SURVEYS:  FOTO 52% goal 68%   COGNITION: Overall cognitive status: Within functional limits for tasks assessed                         SENSATION: Limited sensation Rt lateral and anterior thigh   EDEMA:      MUSCLE LENGTH: 08/24/22: Hamstrings: Right 70 deg; Left 75 deg Thomas test: Right positive 10 deg   POSTURE: flexed trunk  and lacks full knee ext on Rt, lacks neutral Rt hip positioning (flexed) in standing   PALPATION: Altered sensation along lateral and anterior Rt thigh   LOWER EXTREMITY ROM:   Active ROM Right eval Left eval 12/6  Hip flexion 100 100 Right 105  Hip extension Lacks 10 deg 5   Hip abduction       Hip adduction       Hip internal rotation 15 15   Hip external rotation 45 45 45  Knee flexion   WFL   Knee extension Lacks 10 deg full   Ankle dorsiflexion       Ankle plantarflexion       Ankle inversion       Ankle eversion        (Blank rows = not tested)   LOWER EXTREMITY MMT:   MMT Right eval Left eval 12/6  Hip flexion 4-   4  Hip extension 4   4  Hip abduction 4   4  Hip adduction 5     Hip internal rotation 4+     Hip external rotation 3+     Knee flexion _0 Knee extension _1 Ankle dorsiflexion       Ankle plantarflexion       Ankle inversion       Ankle eversion        (Blank rows = not tested)   LOWER EXTREMITY SPECIAL TESTS:      FUNCTIONAL TESTS:  Eval 08/24/22: 5x sit to  stand: 19 sec with UE use on chair TUG: 26" with SPC 3' walk test: 67' with 2-wheeled walker    12/12: 5x sit to stand 17.96 knee pain with UE use on thighs TUG with cane 19.96 with UE use on chair   GAIT: Eval 08/24/22: Distance walked: 303' (3' walk test) Assistive device utilized: Environmental consultant - 2 wheeled Level of assistance: Modified independence Comments: lacks terminal knee extension on Rt, flexed trunk over Rt hip, lacks Rt hip extension and gluteal activation     TODAY'S TREATMENT:   12/26: NuStep L1 old model  x 10', PT present to monitor and discuss symptoms Forward walk at the railing 2 laps Backwards walk at the railing 2 laps March at the railing 2 laps Sidestepping at the railing 2 laps 2 inch left step taps 10x  2 inch forward step ups 10x right  Seated hip flexion/abduction to 4 inch step to the side holding 4# on knee 20x Single arm dead lifts to step stool 5x right/left  5# kettlebell snatch to shoulder in regular and staggered stance 5x each side  2 inch right lateral step ups 10x Seated Fitter blue cord 60x Purple X-heavy loop hip press/thrust forward (held by PT) 10x  12/21: NuStep L3 new model  x 10', PT present to monitor and discuss symptoms 2 inch alternating step taps 10x  2 inch forward step ups 15x right  Seated hip flexion/abduction to 4 inch step to the side holding 4# on knee 2 inch right lateral step ups 10x Walking 5 steps next to the railing x3  Sidestepping at the railing 3 laps Seated green band clams 10x double; single leg 10x each side Seated Fitter blue cord 30x Purple X-heavy loop hip press/thrust forward (held by PT) 15x WB on right,  LE 3 ways  at the railing to circles 12x      12/20: NuStep L1 old model  x 10', PT present to monitor and discuss symptoms 4# ankle weight up and over low cone x12 Seated green band clams 10x double; single leg 10x each side Sit to stand from mat table 5x no hands 2 inch step taps  left, WB on right 10x  Purple X-heavy loop hip press forward (held by PT) 10x Sidestepping at the railing 3 laps WB on right,  LE 3 ways  at the railing to circles 12x 2 inch right lateral step ups 10x Seated green band right single leg press out stomp on the brake 10x 2      PATIENT EDUCATION:  Education details: Access Code: [email or web address] Person educated: Patient Education method: Consulting civil engineer, Demonstration, Verbal cues, and Handouts Education comprehension: verbalized understanding and returned demonstration   HOME EXERCISE PROGRAM: Access Code: [email or web address] URL: [email or web address] Date: 09/12/2022 Prepared by: [place] [employer]  - 2 x daily - 7 x weekly - 1 sets - 3 reps - 20 hold - Seated Hamstring Stretch  - 2 x daily - 7 x weekly - 1 sets - 3 reps - 20 hold - Hip Flexor Stretch with Chair  - 2 x daily - 7 x weekly - 2 sets - 10 reps - Clamshell (Mirrored)  - 1 x daily - 7 x weekly - 1 sets - 10 reps - Supine Bridge  - 1 x daily - 7 x weekly - 1 sets - 10 reps - Sit to Stand  - 1 x daily - 7 x weekly - 1 sets - 10 reps - Side Stepping with Counter Support  - 1 x daily - 7 x weekly - 1 sets - 10 reps - Standing Forward Step Taps with Counter Support  - 1 x daily - 7 x weekly - 1 sets - 10 reps ASSESSMENT:   CLINICAL IMPRESSION: Since she is off work today, the patient has the muscular endurance and stamina to perform longer duration standing with short seated rest breaks intermittently.  Verbal cues for hip extension to stand fully upright.  Decreasing dependence on cane noted.      ACTIVITY LIMITATIONS: lifting, bending, squatting, sleeping, stairs, and locomotion level   PARTICIPATION LIMITATIONS: meal prep, cleaning, laundry, shopping, community activity, occupation, and yard work   PERSONAL FACTORS: Time since onset of injury/illness/exacerbation and 1-2 comorbidities: bil knee OA, Lt hip OA  are also affecting patient's  functional outcome.    REHAB POTENTIAL: Good   CLINICAL DECISION MAKING: Stable/uncomplicated   EVALUATION COMPLEXITY: Low     GOALS: Goals reviewed with patient? Yes   SHORT TERM GOALS: Target date: 09/21/2022  Pt will be ind with initial HEP without exacerbation of symptoms. Baseline: Goal status: 09/08/22: Met   2.  Improve 5x sit to stand to 14 sec or less Baseline:  19 sec Goal status: goal met 12/12   3.  Improve TUG to 18 sec or less with SPC Baseline: 26 sec with SPC Goal status: goal met 12/12       LONG TERM GOALS: Target date: 10/19/2022    Pt will be ind with advanced HEP and understand how to safely progress Baseline:  Goal status: INITIAL   2.  Pt will improve FOTO score to at least 68% to demo improved function. Baseline: 52% Goal status: INITIAL   3.  Pt will be able to sleep on Rt side for up to 2 hours before needing to change positions Baseline: up to 1 hour now Goal status: INITIAL   4.  Pt will demo symmetrical gait pattern with full Rt hip and knee extension within 3' walk test with SPC vs walker covering at least 300' Baseline: 303' with walker Goal status: INITIAL   5.  Pt will achieve at least 4+/5 strength of Rt hip and thigh to allow for improved squatting, stairs and gait Baseline:  Goal status: INITIAL   6.  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 Baseline:  Goal status: INITIAL     PLAN:   PT FREQUENCY: 2x/week   PT DURATION: 8 weeks   PLANNED INTERVENTIONS: Therapeutic exercises, Therapeutic activity, Neuromuscular re-education, Balance training, Gait training, Patient/Family education, Self Care, Joint mobilization, Stair training, Aquatic Therapy, Dry Needling, Electrical stimulation, Spinal mobilization, Cryotherapy, Moist heat, Taping, Vasopneumatic device, Ionotophoresis 82m/ml Dexamethasone, and Manual therapy   PLAN FOR NEXT SESSION: ERO;  TUG; 5x sit to stand;  FOTO; 3 min walk test; try leg press; 2 inch forward step ups; Nustep, RT hip strength and ROM; 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

## 2022-10-18 ENCOUNTER — Ambulatory Visit: Payer: BC Managed Care – PPO | Admitting: Physical Therapy

## 2022-10-19 ENCOUNTER — Ambulatory Visit: Payer: BC Managed Care – PPO | Admitting: Physical Therapy

## 2022-10-19 DIAGNOSIS — M25551 Pain in right hip: Secondary | ICD-10-CM

## 2022-10-19 DIAGNOSIS — M6281 Muscle weakness (generalized): Secondary | ICD-10-CM

## 2022-10-19 NOTE — Therapy (Signed)
OUTPATIENT PHYSICAL THERAPY TREATMENT NOTE/RECERTIFICATION   Patient Name: Audrey Bradley MRN: [id number] DOB:1966-05-30, 56 y.o., female Today's Date: 10/19/2022  PCP:  Lanier Prude, PA   REFERRING PROVIDER: Jearld Lesch, PA   END OF SESSION:   PT End of Session - 10/19/22 0850     Visit Number 12    Date for PT Re-Evaluation 12/14/22    Authorization Type BCBS    PT Start Time [id number]    PT Stop Time 0930    PT Time Calculation (min) 39 min    Activity Tolerance Patient tolerated treatment well                     Past Medical History:  Diagnosis Date   Anxiety    Asthma    Seasonal   Hypothyroidism    Pneumonia    PONV (postoperative nausea and vomiting)    Past Surgical History:  Procedure Laterality Date   ABDOMINAL HYSTERECTOMY     APPENDECTOMY     BARIATRIC SURGERY     lapband   CHOLECYSTECTOMY     FOOT SURGERY Left    tendon repair   MASS EXCISION     left underarm   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   Procedure: TOTAL HIP ARTHROPLASTY ANTERIOR APPROACH;  Surgeon: Gaynelle Arabian, MD;  Location: [facility];  Service: Orthopedics;  Laterality: Right;   Patient Active Problem List   Diagnosis Date Noted   Osteoarthritis of right hip 06/12/2022   Unilateral primary osteoarthritis, right hip 10/07/2020   Bilateral primary osteoarthritis of hip 09/30/2020   Morbid exogenous obesity ([place]) 09/30/2020   BMI 50.0-59.9, adult [facility]) 09/30/2020   Lapband Vanguard 11cm June 2007 [place], [place], [place]) 09/12/2013    REFERRING DIAG: Z51.89 (ICD-10-CM) - Encounter for other specified aftercare    THERAPY DIAG:  Muscle weakness (generalized)  Pain in right hip  Rationale for Evaluation and Treatment Rehabilitation  PERTINENT HISTORY: Was on a walker for 3 years prior to surgery;  Right anterior THR 06/12/22;   PMH: anxiety, asthma, hypothyroidism   PRECAUTIONS: None  SUBJECTIVE:                                                                                                                                                                                       SUBJECTIVE STATEMENT:    I was fine after last time.  The doctor might clear me to go to the pool today (healing abscess/wound)  April 30th flying to [place]:  "I want to go without my cane."  PAIN:  PAIN:  Are you having pain? no NPRS scale: 0/10  Pain location:  right hip   OBJECTIVE: (objective measures completed at initial evaluation unless otherwise dated)  DIAGNOSTIC FINDINGS:    PATIENT SURVEYS:  FOTO 52% goal 68% 12/28:  59%   COGNITION: Overall cognitive status: Within functional limits for tasks assessed                         SENSATION: Limited sensation Rt lateral and anterior thigh   EDEMA:      MUSCLE LENGTH: 08/24/22: Hamstrings: Right 70 deg; Left 75 deg Thomas test: Right positive 10 deg   POSTURE: flexed trunk  and lacks full knee ext on Rt, lacks neutral Rt hip positioning (flexed) in standing   PALPATION: Altered sensation along lateral and anterior Rt thigh   LOWER EXTREMITY ROM:   Active ROM Right eval Left eval 12/6 12/28  Hip flexion 100 100 Right 105 Right 110  Hip extension Lacks 10 deg 5    Hip abduction        Hip adduction        Hip internal rotation 15 15    Hip external rotation 45 45 45 45  Knee flexion   WFL    Knee extension Lacks 10 deg full  full  Ankle dorsiflexion        Ankle plantarflexion        Ankle inversion        Ankle eversion         (Blank rows = not tested)   LOWER EXTREMITY MMT:   MMT Right eval Left eval 12/6 12/28  Hip flexion 4-   4 4+  Hip extension _0 Hip abduction _1 Hip adduction 5      Hip internal rotation 4+      Hip external rotation 3+      Knee flexion _2 4+  Knee extension _3 4+  Ankle dorsiflexion        Ankle plantarflexion        Ankle inversion        Ankle eversion         (Blank rows = not tested)   LOWER EXTREMITY SPECIAL TESTS:       FUNCTIONAL TESTS:  Eval 08/24/22: 5x sit to stand: 19 sec with UE use on chair TUG: 26" with SPC 3' walk test: 57' with 2-wheeled walker    12/12: 5x sit to stand 17.96 knee pain with UE use on thighs TUG with cane 19.96 with UE use on chair  12/28: 5x sit to stand no hands 13.29 sec TUG 15.57 no hands on chair with cane 3 MWT:  400 feet with cane (tripped 1x)   GAIT: Eval 08/24/22: Distance walked: 303' (3' walk test) Assistive device utilized: Environmental consultant - 2 wheeled Level of assistance: Modified independence Comments: lacks terminal knee extension on Rt, flexed trunk over Rt hip, lacks Rt hip extension and gluteal activation     TODAY'S TREATMENT:   12/28: FOTO NuStep L1 old model  seat 8 x 5', PT present to monitor and discuss symptoms TUG 5x sit to stand 3 MWT with cane Leg press seat 7 (needs assist to lift both legs to platform) bil 60# 20x; right only 30# 15x 2nd round on Nu-Step per patient request to help with stiffness: L3 8 min  Therapeutic activities:  sit to stand, standing, walking, negotiating curbs, steps,  12/26: NuStep L1 old model  x 10', PT present to monitor and discuss symptoms Forward walk at the railing 2 laps Backwards walk at the railing 2 laps March at the railing 2 laps Sidestepping at the railing 2 laps 2 inch left step taps 10x  2 inch forward step ups 10x right  Seated hip flexion/abduction to 4 inch step to the side holding 4# on knee 20x Single arm dead lifts to step stool 5x right/left  5# kettlebell snatch to shoulder in regular and staggered stance 5x each side  2 inch right lateral step ups 10x Seated Fitter blue cord 60x Purple X-heavy loop hip press/thrust forward (held by PT) 10x         12/21: NuStep L3 new model  x 10', PT present to monitor and discuss symptoms 2 inch alternating step taps 10x  2 inch forward step ups 15x right  Seated hip flexion/abduction to 4 inch step to the side holding 4# on  knee 2 inch right lateral step ups 10x Walking 5 steps next to the railing x3  Sidestepping at the railing 3 laps Seated green band clams 10x double; single leg 10x each side Seated Fitter blue cord 30x Purple X-heavy loop hip press/thrust forward (held by PT) 15x WB on right,  LE 3 ways  at the railing to circles 12x     PATIENT EDUCATION:  Education details: Access Code: [email or web address] Person educated: Patient Education method: Consulting civil engineer, Demonstration, Verbal cues, and Handouts Education comprehension: verbalized understanding and returned demonstration   HOME EXERCISE PROGRAM: Access Code: [email or web address] URL: [email or web address] Date: 09/12/2022 Prepared by: [place] on Wall  - 2 x daily - 7 x weekly - 1 sets - 3 reps - 20 hold - Seated Hamstring Stretch  - 2 x daily - 7 x weekly - 1 sets - 3 reps - 20 hold - Hip Flexor Stretch with Chair  - 2 x daily - 7 x weekly - 2 sets - 10 reps - Clamshell (Mirrored)  - 1 x daily - 7 x weekly - 1 sets - 10 reps - Supine Bridge  - 1 x daily - 7 x weekly - 1 sets - 10 reps - Sit to Stand  - 1 x daily - 7 x weekly - 1 sets - 10 reps - Side Stepping with Counter Support  - 1 x daily - 7 x weekly - 1 sets - 10 reps - Standing Forward Step Taps with Counter Support  - 1 x daily - 7 x weekly - 1 sets - 10 reps ASSESSMENT:   CLINICAL IMPRESSION: The patient would benefit from a continuation of skilled PT for a further progression of strengthening and functional mobility including progression to ambulation without an assistive device.  She is progressing very well in aspects of PT: strength, ROM, outcome measure and functional tests.  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  Will continue to update and promote independence in a HEP needed for a return to the highest functional level possible with ADLs.        ACTIVITY LIMITATIONS: lifting,  bending, squatting, sleeping, stairs, and locomotion level   PARTICIPATION LIMITATIONS: meal prep, cleaning, laundry, shopping, community activity, occupation, and yard work   PERSONAL FACTORS: Time since onset of injury/illness/exacerbation and 1-2 comorbidities: bil knee OA, Lt hip OA  are also affecting patient's functional outcome.    REHAB POTENTIAL: Good  CLINICAL DECISION MAKING: Stable/uncomplicated   EVALUATION COMPLEXITY: Low     GOALS: Goals reviewed with patient? Yes   SHORT TERM GOALS: Target date: 09/21/2022  Pt will be ind with initial HEP without exacerbation of symptoms. Baseline: Goal status: 09/08/22: Met   2.  Improve 5x sit to stand to 14 sec or less Baseline: 19 sec Goal status: goal met 12/12   3.  Improve TUG to 18 sec or less with SPC Baseline: 26 sec with SPC Goal status: goal met 12/12       LONG TERM GOALS: Target date: 10/19/2022    Pt will be ind with advanced HEP and understand how to safely progress Baseline:  Goal status:ongoing   2.  Pt will improve FOTO score to at least 68% to demo improved function. Baseline: 52% Goal status: ongoing   3.  Pt will be able to sleep on Rt side for up to 2 hours before needing to change positions Baseline: up to 1 hour now Goal status: met 12/28   4.  Pt will demo symmetrical gait pattern with full Rt hip and knee extension within 3' walk test with SPC vs walker covering at least 300' Baseline: 303' with walker Goal status: goal met 12/28   5.  Pt will achieve at least 4+/5 strength of Rt hip and thigh to allow for improved squatting, stairs and gait Baseline:  Goal status: ongoing   6.  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 Baseline:  Goal status: goal met 12/28  7. 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

## 2022-10-20 ENCOUNTER — Encounter: Payer: BC Managed Care – PPO | Admitting: Physical Therapy

## 2022-10-24 ENCOUNTER — Encounter: Payer: Self-pay | Admitting: Physical Therapy

## 2022-10-24 ENCOUNTER — Ambulatory Visit: Payer: BC Managed Care – PPO | Attending: Student | Admitting: Physical Therapy

## 2022-10-24 DIAGNOSIS — R293 Abnormal posture: Secondary | ICD-10-CM | POA: Diagnosis present

## 2022-10-24 DIAGNOSIS — M25551 Pain in right hip: Secondary | ICD-10-CM | POA: Insufficient documentation

## 2022-10-24 DIAGNOSIS — M6281 Muscle weakness (generalized): Secondary | ICD-10-CM | POA: Insufficient documentation

## 2022-10-24 NOTE — Therapy (Signed)
OUTPATIENT PHYSICAL THERAPY TREATMENT NOTE   Patient Name: Audrey Bradley MRN: [id number] DOB:09-11-1966, 57 y.o., female Today's Date: 10/24/2022  PCP:  Lanier Prude, PA   REFERRING PROVIDER: Jearld Lesch, PA   END OF SESSION:   PT End of Session - 10/24/22 1619     Visit Number 13    Date for PT Re-Evaluation 12/14/22    Authorization Type BCBS    PT Start Time 0045    PT Stop Time 1655    PT Time Calculation (min) 40 min    Activity Tolerance Patient tolerated treatment well;No increased pain    Behavior During Therapy WFL for tasks assessed/performed                      Past Medical History:  Diagnosis Date   Anxiety    Asthma    Seasonal   Hypothyroidism    Pneumonia    PONV (postoperative nausea and vomiting)    Past Surgical History:  Procedure Laterality Date   ABDOMINAL HYSTERECTOMY     APPENDECTOMY     BARIATRIC SURGERY     lapband   CHOLECYSTECTOMY     FOOT SURGERY Left    tendon repair   MASS EXCISION     left underarm   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   Procedure: TOTAL HIP ARTHROPLASTY ANTERIOR APPROACH;  Surgeon: Gaynelle Arabian, MD;  Location: [facility];  Service: Orthopedics;  Laterality: Right;   Patient Active Problem List   Diagnosis Date Noted   Osteoarthritis of right hip 06/12/2022   Unilateral primary osteoarthritis, right hip 10/07/2020   Bilateral primary osteoarthritis of hip 09/30/2020   Morbid exogenous obesity ([place]) 09/30/2020   BMI 50.0-59.9, adult [facility]) 09/30/2020   Lapband Vanguard 11cm June 2007 [place], [place], [place]) 09/12/2013    REFERRING DIAG: Z51.89 (ICD-10-CM) - Encounter for other specified aftercare    THERAPY DIAG:  Muscle weakness (generalized)  Pain in right hip  Abnormal posture  Rationale for Evaluation and Treatment Rehabilitation  PERTINENT HISTORY: Was on a walker for 3 years prior to surgery;  Right anterior THR 06/12/22;   PMH: anxiety, asthma, hypothyroidism   PRECAUTIONS:  None  SUBJECTIVE:                                                                                                                                                                                      SUBJECTIVE STATEMENT:    Pt states that she was cleared to go to the pool this week. She has no pain at this time.  Noted some soreness in the Rt leg the night of her last session.  PAIN:  PAIN:  Are you having pain? no NPRS scale: 0/10  Pain location: right hip   OBJECTIVE: (objective measures completed at initial evaluation unless otherwise dated)  DIAGNOSTIC FINDINGS:    PATIENT SURVEYS:  FOTO 52% goal 68% 12/28:  59%   COGNITION: Overall cognitive status: Within functional limits for tasks assessed                         SENSATION: Limited sensation Rt lateral and anterior thigh   EDEMA:      MUSCLE LENGTH: 08/24/22: Hamstrings: Right 70 deg; Left 75 deg Thomas test: Right positive 10 deg   POSTURE: flexed trunk  and lacks full knee ext on Rt, lacks neutral Rt hip positioning (flexed) in standing   PALPATION: Altered sensation along lateral and anterior Rt thigh   LOWER EXTREMITY ROM:   Active ROM Right eval Left eval 12/6 12/28  Hip flexion 100 100 Right 105 Right 110  Hip extension Lacks 10 deg 5    Hip abduction        Hip adduction        Hip internal rotation 15 15    Hip external rotation 45 45 45 45  Knee flexion   WFL    Knee extension Lacks 10 deg full  full  Ankle dorsiflexion        Ankle plantarflexion        Ankle inversion        Ankle eversion         (Blank rows = not tested)   LOWER EXTREMITY MMT:   MMT Right eval Left eval 12/6 12/28  Hip flexion 4-   4 4+  Hip extension _0 Hip abduction _1 Hip adduction 5      Hip internal rotation 4+      Hip external rotation 3+      Knee flexion _2 4+  Knee extension _3 4+  Ankle dorsiflexion        Ankle plantarflexion        Ankle inversion        Ankle eversion          (Blank rows = not tested)   LOWER EXTREMITY SPECIAL TESTS:      FUNCTIONAL TESTS:  Eval 08/24/22: 5x sit to stand: 19 sec with UE use on chair TUG: 26" with SPC 3' walk test: 96' with 2-wheeled walker    12/12: 5x sit to stand 17.96 knee pain with UE use on thighs TUG with cane 19.96 with UE use on chair  12/28: 5x sit to stand no hands 13.29 sec TUG 15.57 no hands on chair with cane 3 MWT:  400 feet with cane (tripped 1x)   GAIT: Eval 08/24/22: Distance walked: 303' (3' walk test) Assistive device utilized: Environmental consultant - 2 wheeled Level of assistance: Modified independence Comments: lacks terminal knee extension on Rt, flexed trunk over Rt hip, lacks Rt hip extension and gluteal activation     TODAY'S TREATMENT:   Nustep: L2 x6 min Leg press #40 2x10, x5 reps 2" step downs 3x8 reps Step over/back with 2 HHA x10 reps Standing hip abd #2 2x10 reps  Standing hip abd tap/hold 5x5 sec each   12/28: FOTO NuStep L1 old model  seat 8 x 5', PT present to monitor and discuss symptoms TUG 5x sit to stand 3 MWT with cane Leg  press seat 7 (needs assist to lift both legs to platform) bil 60# 20x; right only 30# 15x 2nd round on Nu-Step per patient request to help with stiffness: L3 8 min  Therapeutic activities:  sit to stand, standing, walking, negotiating curbs, steps,            12/26: NuStep L1 old model  x 10', PT present to monitor and discuss symptoms Forward walk at the railing 2 laps Backwards walk at the railing 2 laps March at the railing 2 laps Sidestepping at the railing 2 laps 2 inch left step taps 10x  2 inch forward step ups 10x right  Seated hip flexion/abduction to 4 inch step to the side holding 4# on knee 20x Single arm dead lifts to step stool 5x right/left  5# kettlebell snatch to shoulder in regular and staggered stance 5x each side  2 inch right lateral step ups 10x Seated Fitter blue cord 60x Purple X-heavy loop hip press/thrust forward  (held by PT) 10x         12/21: NuStep L3 new model  x 10', PT present to monitor and discuss symptoms 2 inch alternating step taps 10x  2 inch forward step ups 15x right  Seated hip flexion/abduction to 4 inch step to the side holding 4# on knee 2 inch right lateral step ups 10x Walking 5 steps next to the railing x3  Sidestepping at the railing 3 laps Seated green band clams 10x double; single leg 10x each side Seated Fitter blue cord 30x Purple X-heavy loop hip press/thrust forward (held by PT) 15x WB on right,  LE 3 ways  at the railing to circles 12x     PATIENT EDUCATION:  Education details: Access Code: [email or web address] Person educated: Patient Education method: Consulting civil engineer, Demonstration, Verbal cues, and Handouts Education comprehension: verbalized understanding and returned demonstration   HOME EXERCISE PROGRAM: Access Code: [email or web address] URL: [email or web address] Date: 09/12/2022 Prepared by: [place] on Wall  - 2 x daily - 7 x weekly - 1 sets - 3 reps - 20 hold - Seated Hamstring Stretch  - 2 x daily - 7 x weekly - 1 sets - 3 reps - 20 hold - Hip Flexor Stretch with Chair  - 2 x daily - 7 x weekly - 2 sets - 10 reps - Clamshell (Mirrored)  - 1 x daily - 7 x weekly - 1 sets - 10 reps - Supine Bridge  - 1 x daily - 7 x weekly - 1 sets - 10 reps - Sit to Stand  - 1 x daily - 7 x weekly - 1 sets - 10 reps - Side Stepping with Counter Support  - 1 x daily - 7 x weekly - 1 sets - 10 reps - Standing Forward Step Taps with Counter Support  - 1 x daily - 7 x weekly - 1 sets - 10 reps ASSESSMENT:   CLINICAL IMPRESSION: Pt arrived without reports of hip pain, but was concerned with the leg press following her last session. Despite her concerns, she completed leg press with increase in resistance and noted no difficulty during this. Attempted several standing strengthening exercises and activities to improve gait and weight  acceptance on the Rt LE. Pt was cleared for aquatic PT and will begin that next visit.      ACTIVITY LIMITATIONS: lifting, bending, squatting, sleeping, stairs, and locomotion level   PARTICIPATION LIMITATIONS: meal prep, cleaning, laundry, shopping, community activity, occupation, and yard work  PERSONAL FACTORS: Time since onset of injury/illness/exacerbation and 1-2 comorbidities: bil knee OA, Lt hip OA  are also affecting patient's functional outcome.    REHAB POTENTIAL: Good   CLINICAL DECISION MAKING: Stable/uncomplicated   EVALUATION COMPLEXITY: Low     GOALS: Goals reviewed with patient? Yes   SHORT TERM GOALS: Target date: 09/21/2022  Pt will be ind with initial HEP without exacerbation of symptoms. Baseline: Goal status: 09/08/22: Met   2.  Improve 5x sit to stand to 14 sec or less Baseline: 19 sec Goal status: goal met 12/12   3.  Improve TUG to 18 sec or less with SPC Baseline: 26 sec with SPC Goal status: goal met 12/12       LONG TERM GOALS: Target date: 10/19/2022    Pt will be ind with advanced HEP and understand how to safely progress Baseline:  Goal status:ongoing   2.  Pt will improve FOTO score to at least 68% to demo improved function. Baseline: 52% Goal status: ongoing   3.  Pt will be able to sleep on Rt side for up to 2 hours before needing to change positions Baseline: up to 1 hour now Goal status: met 12/28   4.  Pt will demo symmetrical gait pattern with full Rt hip and knee extension within 3' walk test with SPC vs walker covering at least 300' Baseline: 303' with walker Goal status: goal met 12/28   5.  Pt will achieve at least 4+/5 strength of Rt hip and thigh to allow for improved squatting, stairs and gait Baseline:  Goal status: ongoing   6.  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 Baseline:  Goal status: goal met 12/28  7. 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

## 2022-10-31 ENCOUNTER — Ambulatory Visit: Payer: BC Managed Care – PPO | Admitting: Physical Therapy

## 2022-11-02 ENCOUNTER — Ambulatory Visit: Payer: BC Managed Care – PPO | Admitting: Physical Therapy

## 2022-11-02 DIAGNOSIS — R293 Abnormal posture: Secondary | ICD-10-CM

## 2022-11-02 DIAGNOSIS — M6281 Muscle weakness (generalized): Secondary | ICD-10-CM | POA: Diagnosis not present

## 2022-11-02 DIAGNOSIS — M25551 Pain in right hip: Secondary | ICD-10-CM

## 2022-11-02 NOTE — Therapy (Signed)
OUTPATIENT PHYSICAL THERAPY TREATMENT NOTE   Patient Name: Audrey Bradley MRN: [id number] DOB:1966/03/10, 57 y.o., female Today's Date: 11/02/2022  PCP:  Daphane Shepherd, PA   REFERRING PROVIDER: Eartha Inch, PA   END OF SESSION:   PT End of Session - 11/02/22 1535     Visit Number 14    Date for PT Re-Evaluation 12/14/22    Authorization Type BCBS    PT Start Time 1535    PT Stop Time 1614    PT Time Calculation (min) 39 min    Activity Tolerance Patient tolerated treatment well                      Past Medical History:  Diagnosis Date   Anxiety    Asthma    Seasonal   Hypothyroidism    Pneumonia    PONV (postoperative nausea and vomiting)    Past Surgical History:  Procedure Laterality Date   ABDOMINAL HYSTERECTOMY     APPENDECTOMY     BARIATRIC SURGERY     lapband   CHOLECYSTECTOMY     FOOT SURGERY Left    tendon repair   MASS EXCISION     left underarm   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   Procedure: TOTAL HIP ARTHROPLASTY ANTERIOR APPROACH;  Surgeon: Ollen Gross, MD;  Location: [facility];  Service: Orthopedics;  Laterality: Right;   Patient Active Problem List   Diagnosis Date Noted   Osteoarthritis of right hip 06/12/2022   Unilateral primary osteoarthritis, right hip 10/07/2020   Bilateral primary osteoarthritis of hip 09/30/2020   Morbid exogenous obesity (HCC) 09/30/2020   BMI 50.0-59.9, adult [facility]) 09/30/2020   Lapband Vanguard 11cm June 2007 [place], [place], [place]) 09/12/2013    REFERRING DIAG: Z51.89 (ICD-10-CM) - Encounter for other specified aftercare    THERAPY DIAG:  Muscle weakness (generalized)  Pain in right hip  Abnormal posture  Rationale for Evaluation and Treatment Rehabilitation  PERTINENT HISTORY: Was on a walker for 3 years prior to surgery;  Right anterior THR 06/12/22;   PMH: anxiety, asthma, hypothyroidism   PRECAUTIONS: None  SUBJECTIVE:                                                                                                                                                                                       SUBJECTIVE STATEMENT:   Was sick for 4 days with respiratory illness.  I wasn't as sore from the leg press last time.     PAIN:  PAIN:  Are you having pain? no NPRS scale: 0/10  Pain location: right hip   OBJECTIVE: (objective measures completed at initial  evaluation unless otherwise dated)  DIAGNOSTIC FINDINGS:    PATIENT SURVEYS:  FOTO 52% goal 68% 12/28:  59%   COGNITION: Overall cognitive status: Within functional limits for tasks assessed                         SENSATION: Limited sensation Rt lateral and anterior thigh   EDEMA:      MUSCLE LENGTH: 08/24/22: Hamstrings: Right 70 deg; Left 75 deg Thomas test: Right positive 10 deg   POSTURE: flexed trunk  and lacks full knee ext on Rt, lacks neutral Rt hip positioning (flexed) in standing   PALPATION: Altered sensation along lateral and anterior Rt thigh   LOWER EXTREMITY ROM:   Active ROM Right eval Left eval 12/6 12/28  Hip flexion 100 100 Right 105 Right 110  Hip extension Lacks 10 deg 5    Hip abduction        Hip adduction        Hip internal rotation 15 15    Hip external rotation 45 45 45 45  Knee flexion   WFL    Knee extension Lacks 10 deg full  full  Ankle dorsiflexion        Ankle plantarflexion        Ankle inversion        Ankle eversion         (Blank rows = not tested)   LOWER EXTREMITY MMT:   MMT Right eval Left eval 12/6 12/28  Hip flexion 4-   4 4+  Hip extension 4   4 4   Hip abduction 4   4 4   Hip adduction 5      Hip internal rotation 4+      Hip external rotation 3+      Knee flexion 4 4 4  4+  Knee extension 4 4 4  4+  Ankle dorsiflexion        Ankle plantarflexion        Ankle inversion        Ankle eversion         (Blank rows = not tested)   LOWER EXTREMITY SPECIAL TESTS:      FUNCTIONAL TESTS:  Eval 08/24/22: 5x sit to stand: 19 sec with UE  use on chair TUG: 26" with SPC 3' walk test: 101' with 2-wheeled walker    12/12: 5x sit to stand 17.96 knee pain with UE use on thighs TUG with cane 19.96 with UE use on chair  12/28: 5x sit to stand no hands 13.29 sec TUG 15.57 no hands on chair with cane 3 MWT:  400 feet with cane (tripped 1x)   GAIT: Eval 08/24/22: Distance walked: 303' (3' walk test) Assistive device utilized: Environmental consultant - 2 wheeled Level of assistance: Modified independence Comments: lacks terminal knee extension on Rt, flexed trunk over Rt hip, lacks Rt hip extension and gluteal activation     TODAY'S TREATMENT:   1/11: Nustep: L2 x6 min Standing right hip extension 2# right 2x10 2 inch forward step ups 10x right (cues for head up to encourage to standing tall) 2 inch lateral step ups 10x Step over/back over target on floor with 1 UE railing support only x10 reps Standing hip abd right #2 2x10 reps  Standing left hip abd tap/hold 10x5 sec each  Leg press seat 7 reclined 30# single leg 20x Therapeutic activities:  sit to stand, standing, walking, negotiating curbs, steps  Nustep: L2 x6 min Leg press #40 2x10, x5 reps 2" step downs 3x8 reps Step over/back with 2 HHA x10 reps Standing hip abd #2 2x10 reps  Standing hip abd tap/hold 5x5 sec each   12/28: FOTO NuStep L1 old model  seat 8 x 5', PT present to monitor and discuss symptoms TUG 5x sit to stand 3 MWT with cane Leg press seat 7 (needs assist to lift both legs to platform) bil 60# 20x; right only 30# 15x 2nd round on Nu-Step per patient request to help with stiffness: L3 8 min  Therapeutic activities:  sit to stand, standing, walking, negotiating curbs, steps,            12/26: NuStep L1 old model  x 10', PT present to monitor and discuss symptoms Forward walk at the railing 2 laps Backwards walk at the railing 2 laps March at the railing 2 laps Sidestepping at the railing 2 laps 2 inch left step taps 10x  2  inch forward step ups 10x right  Seated hip flexion/abduction to 4 inch step to the side holding 4# on knee 20x Single arm dead lifts to step stool 5x right/left  5# kettlebell snatch to shoulder in regular and staggered stance 5x each side  2 inch right lateral step ups 10x Seated Fitter blue cord 60x Purple X-heavy loop hip press/thrust forward (held by PT) 10x        PATIENT EDUCATION:  Education details: Access Code: [email or web address] Person educated: Patient Education method: Consulting civil engineer, Media planner, Verbal cues, and Handouts Education comprehension: verbalized understanding and returned demonstration   HOME EXERCISE PROGRAM: Access Code: [email or web address] URL: [email or web address] Date: 09/12/2022 Prepared by: [place] on Wall  - 2 x daily - 7 x weekly - 1 sets - 3 reps - 20 hold - Seated Hamstring Stretch  - 2 x daily - 7 x weekly - 1 sets - 3 reps - 20 hold - Hip Flexor Stretch with Chair  - 2 x daily - 7 x weekly - 2 sets - 10 reps - Clamshell (Mirrored)  - 1 x daily - 7 x weekly - 1 sets - 10 reps - Supine Bridge  - 1 x daily - 7 x weekly - 1 sets - 10 reps - Sit to Stand  - 1 x daily - 7 x weekly - 1 sets - 10 reps - Side Stepping with Counter Support  - 1 x daily - 7 x weekly - 1 sets - 10 reps - Standing Forward Step Taps with Counter Support  - 1 x daily - 7 x weekly - 1 sets - 10 reps ASSESSMENT:   CLINICAL IMPRESSION: Short breaks secondary to fatigue from recent respiratory illness but able to continue with more standing/weight bearing type ex's today.  Verbal cues given for forward gaze/head up to stand fully erect.  Decreasing dependence on cane noted.     ACTIVITY LIMITATIONS: lifting, bending, squatting, sleeping, stairs, and locomotion level   PARTICIPATION LIMITATIONS: meal prep, cleaning, laundry, shopping, community activity, occupation, and yard work   PERSONAL FACTORS: Time since onset of  injury/illness/exacerbation and 1-2 comorbidities: bil knee OA, Lt hip OA  are also affecting patient's functional outcome.    REHAB POTENTIAL: Good   CLINICAL DECISION MAKING: Stable/uncomplicated   EVALUATION COMPLEXITY: Low     GOALS: Goals reviewed with patient? Yes   SHORT TERM GOALS: Target date: 09/21/2022  Pt will be ind with initial HEP without exacerbation of symptoms.  Baseline: Goal status: 09/08/22: Met   2.  Improve 5x sit to stand to 14 sec or less Baseline: 19 sec Goal status: goal met 12/12   3.  Improve TUG to 18 sec or less with SPC Baseline: 26 sec with SPC Goal status: goal met 12/12       LONG TERM GOALS: Target date: 10/19/2022    Pt will be ind with advanced HEP and understand how to safely progress Baseline:  Goal status:ongoing   2.  Pt will improve FOTO score to at least 68% to demo improved function. Baseline: 52% Goal status: ongoing   3.  Pt will be able to sleep on Rt side for up to 2 hours before needing to change positions Baseline: up to 1 hour now Goal status: met 12/28   4.  Pt will demo symmetrical gait pattern with full Rt hip and knee extension within 3' walk test with SPC vs walker covering at least 300' Baseline: 303' with walker Goal status: goal met 12/28   5.  Pt will achieve at least 4+/5 strength of Rt hip and thigh to allow for improved squatting, stairs and gait Baseline:  Goal status: ongoing   6.  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 Baseline:  Goal status: goal met 12/28  7. Able to walk 200 feet without the cane       PLAN:   PT FREQUENCY: 2x/week   PT DURATION: 8 weeks   PLANNED INTERVENTIONS: Therapeutic exercises, Therapeutic activity, Neuromuscular re-education, Balance training, Gait training, Patient/Family education, Self Care, Joint mobilization, Stair training, Aquatic Therapy, Dry Needling, Electrical stimulation, Spinal  mobilization, Cryotherapy, Moist heat, Taping, Vasopneumatic device, Ionotophoresis 4mg /ml Dexamethasone, and Manual therapy   PLAN FOR NEXT SESSION: progress RT hip strength and ROM; improve weight acceptance on Rt; pt has returned to work as a , PT 11/02/22 5:03 PM Phone: [phone] Fax: [phone]

## 2022-11-07 ENCOUNTER — Ambulatory Visit: Payer: BC Managed Care – PPO | Admitting: Physical Therapy

## 2022-11-07 DIAGNOSIS — M25551 Pain in right hip: Secondary | ICD-10-CM

## 2022-11-07 DIAGNOSIS — M6281 Muscle weakness (generalized): Secondary | ICD-10-CM | POA: Diagnosis not present

## 2022-11-07 NOTE — Therapy (Addendum)
OUTPATIENT PHYSICAL THERAPY TREATMENT NOTE   Patient Name: Audrey Bradley MRN: [id number] DOB:[birth year], 57 y.o., female Today's Date: 11/07/2022  PCP:  Lanier Prude, PA   REFERRING PROVIDER: Jearld Lesch, PA   END OF SESSION:   PT End of Session - 11/07/22 1537     Visit Number 15    Date for PT Re-Evaluation 12/14/22    Authorization Type BCBS    PT Start Time 1533    PT Stop Time [id number]    PT Time Calculation (min) 41 min    Activity Tolerance Patient tolerated treatment well                      Past Medical History:  Diagnosis Date   Anxiety    Asthma    Seasonal   Hypothyroidism    Pneumonia    PONV (postoperative nausea and vomiting)    Past Surgical History:  Procedure Laterality Date   ABDOMINAL HYSTERECTOMY     APPENDECTOMY     BARIATRIC SURGERY     lapband   CHOLECYSTECTOMY     FOOT SURGERY Left    tendon repair   MASS EXCISION     left underarm   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   Procedure: TOTAL HIP ARTHROPLASTY ANTERIOR APPROACH;  Surgeon: Gaynelle Arabian, MD;  Location: [facility];  Service: Orthopedics;  Laterality: Right;   Patient Active Problem List   Diagnosis Date Noted   Osteoarthritis of right hip 06/12/2022   Unilateral primary osteoarthritis, right hip 10/07/2020   Bilateral primary osteoarthritis of hip 09/30/2020   Morbid exogenous obesity ([place]) 09/30/2020   BMI 50.0-59.9, adult [facility]) 09/30/2020   Lapband Vanguard 11cm June 2007 [place], [place], [place]) 09/12/2013    REFERRING DIAG: Z51.89 (ICD-10-CM) - Encounter for other specified aftercare    THERAPY DIAG:  Muscle weakness (generalized)  Pain in right hip  Rationale for Evaluation and Treatment Rehabilitation  PERTINENT HISTORY: Was on a walker for 3 years prior to surgery;  Right anterior THR 06/12/22;   PMH: anxiety, asthma, hypothyroidism   PRECAUTIONS: None  SUBJECTIVE:                                                                                                                                                                                       SUBJECTIVE STATEMENT:   I'm still hacking.  I went back to the doctor and now I'm on a Zpack and cough medicine.  Saw the surgeon and got a note to keep using the cane at work (accommodations). PAIN:  PAIN:  Are you having pain? no NPRS scale: 0/10  Pain location: right  hip   OBJECTIVE: (objective measures completed at initial evaluation unless otherwise dated)  DIAGNOSTIC FINDINGS:    PATIENT SURVEYS:  FOTO 52% goal 68% 12/28:  59%   COGNITION: Overall cognitive status: Within functional limits for tasks assessed                         SENSATION: Limited sensation Rt lateral and anterior thigh   EDEMA:      MUSCLE LENGTH: 08/24/22: Hamstrings: Right 70 deg; Left 75 deg Thomas test: Right positive 10 deg   POSTURE: flexed trunk  and lacks full knee ext on Rt, lacks neutral Rt hip positioning (flexed) in standing   PALPATION: Altered sensation along lateral and anterior Rt thigh   LOWER EXTREMITY ROM:   Active ROM Right eval Left eval 12/6 12/28  Hip flexion 100 100 Right 105 Right 110  Hip extension Lacks 10 deg 5    Hip abduction        Hip adduction        Hip internal rotation 15 15    Hip external rotation 45 45 45 45  Knee flexion   WFL    Knee extension Lacks 10 deg full  full  Ankle dorsiflexion        Ankle plantarflexion        Ankle inversion        Ankle eversion         (Blank rows = not tested)   LOWER EXTREMITY MMT:   MMT Right eval Left eval 12/6 12/28  Hip flexion 4-   4 4+  Hip extension '4   4 4  '$ Hip abduction '4   4 4  '$ Hip adduction 5      Hip internal rotation 4+      Hip external rotation 3+      Knee flexion '4 4 4 '$ 4+  Knee extension '4 4 4 '$ 4+  Ankle dorsiflexion        Ankle plantarflexion        Ankle inversion        Ankle eversion         (Blank rows = not tested)   LOWER EXTREMITY SPECIAL TESTS:      FUNCTIONAL TESTS:   Eval 08/24/22: 5x sit to stand: 19 sec with UE use on chair TUG: 26" with SPC 3' walk test: 43' with 2-wheeled walker    12/12: 5x sit to stand 17.96 knee pain with UE use on thighs TUG with cane 19.96 with UE use on chair  12/28: 5x sit to stand no hands 13.29 sec TUG 15.57 no hands on chair with cane 3 MWT:  400 feet with cane (tripped 1x)   GAIT: Eval 08/24/22: Distance walked: 303' (3' walk test) Assistive device utilized: Environmental consultant - 2 wheeled Level of assistance: Modified independence Comments: lacks terminal knee extension on Rt, flexed trunk over Rt hip, lacks Rt hip extension and gluteal activation     TODAY'S TREATMENT:   1/16: Nustep: L2 x10 min Standing right hip extension 2# right 2x10 2 inch forward step ups 15x right (cues for head up to encourage to standing tall) 2 inch lateral step ups 10x 5 sec hold Left Step over/back over weight on floor with 1 UE railing support and 2 finger support  x10 reps Standing hip abd right #2 2x10 reps  Standing left hip abd over weight on floor tap/hold 10x5 sec each  Purple X-heavy loop hip press  forward with therapist anchoring 10x Seated X-heavy loop single leg press out 20x Seated X-heavy loop LE press to floor "stomp on the bug" 15x Therapeutic activities:  sit to stand, standing, walking, negotiating curbs, steps       1/11: Nustep: L2 x6 min Standing right hip extension 2# right 2x10 2 inch forward step ups 10x right (cues for head up to encourage to standing tall) 2 inch lateral step ups 10x Step over/back over target on floor with 1 UE railing support only x10 reps Standing hip abd right #2 2x10 reps  Standing left hip abd tap/hold 10x5 sec each  Leg press seat 7 reclined 30# single leg 20x Therapeutic activities:  sit to stand, standing, walking, negotiating curbs, steps       PATIENT EDUCATION:  Education details: Access Code: [email or web address] Person educated: Patient Education method: Consulting civil engineer,  Demonstration, Verbal cues, and Handouts Education comprehension: verbalized understanding and returned demonstration   HOME EXERCISE PROGRAM: Access Code: [email or web address] URL: [email or web address] Date: 09/12/2022 Prepared by: [place] on Wall  - 2 x daily - 7 x weekly - 1 sets - 3 reps - 20 hold - Seated Hamstring Stretch  - 2 x daily - 7 x weekly - 1 sets - 3 reps - 20 hold - Hip Flexor Stretch with Chair  - 2 x daily - 7 x weekly - 2 sets - 10 reps - Clamshell (Mirrored)  - 1 x daily - 7 x weekly - 1 sets - 10 reps - Supine Bridge  - 1 x daily - 7 x weekly - 1 sets - 10 reps - Sit to Stand  - 1 x daily - 7 x weekly - 1 sets - 10 reps - Side Stepping with Counter Support  - 1 x daily - 7 x weekly - 1 sets - 10 reps - Standing Forward Step Taps with Counter Support  - 1 x daily - 7 x weekly - 1 sets - 10 reps ASSESSMENT:   CLINICAL IMPRESSION: Improving right LE weight bearing tolerance with reduced UE reliance.   Needs at least 2 finger touch for most ex's for stability.  Patient able to progress intensity of ex today without exacerbation of pain but expected amount of muscular fatigue.     ACTIVITY LIMITATIONS: lifting, bending, squatting, sleeping, stairs, and locomotion level   PARTICIPATION LIMITATIONS: meal prep, cleaning, laundry, shopping, community activity, occupation, and yard work   PERSONAL FACTORS: Time since onset of injury/illness/exacerbation and 1-2 comorbidities: bil knee OA, Lt hip OA  are also affecting patient's functional outcome.    REHAB POTENTIAL: Good   CLINICAL DECISION MAKING: Stable/uncomplicated   EVALUATION COMPLEXITY: Low     GOALS: Goals reviewed with patient? Yes   SHORT TERM GOALS: Target date: 09/21/2022  Pt will be ind with initial HEP without exacerbation of symptoms. Baseline: Goal status: 09/08/22: Met   2.  Improve 5x sit to stand to 14 sec or less Baseline: 19 sec Goal status: goal  met 12/12   3.  Improve TUG to 18 sec or less with SPC Baseline: 26 sec with SPC Goal status: goal met 12/12       LONG TERM GOALS: Target date: 12/14/2022   Pt will be ind with advanced HEP and understand how to safely progress Baseline:  Goal status:ongoing   2.  Pt will improve FOTO score to at least 68% to demo improved function. Baseline: 52% Goal status: ongoing   3.  Pt  will be able to sleep on Rt side for up to 2 hours before needing to change positions Baseline: up to 1 hour now Goal status: met 12/28   4.  Pt will demo symmetrical gait pattern with full Rt hip and knee extension within 3' walk test with SPC vs walker covering at least 300' Baseline: 303' with walker Goal status: goal met 12/28   5.  Pt will achieve at least 4+/5 strength of Rt hip and thigh to allow for improved squatting, stairs and gait Baseline:  Goal status: ongoing   6.  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 Baseline:  Goal status: goal met 12/28  7. Able to walk 200 feet without the cane       PLAN:   PT FREQUENCY: 2x/week   PT DURATION: 8 weeks   PLANNED INTERVENTIONS: Therapeutic exercises, Therapeutic activity, Neuromuscular re-education, Balance training, Gait training, Patient/Family education, Self Care, Joint mobilization, Stair training, Aquatic Therapy, Dry Needling, Electrical stimulation, Spinal mobilization, Cryotherapy, Moist heat, Taping, Vasopneumatic device, Ionotophoresis '4mg'$ /ml Dexamethasone, and Manual therapy   PLAN FOR NEXT SESSION: progress RT hip strength and ROM; improve weight acceptance on Rt; pt has returned to work as a Oncologist  Ruben Im, PT 11/07/22 5:47 PM Phone: [phone] Fax: [phone]    PHYSICAL THERAPY DISCHARGE SUMMARY  Visits from Start of Care: 15  Current functional level related to goals / functional outcomes: Pt has not returned to clinic since  11/07/22.  She called and self-discharged and plans to talk to MD about transition to aquatic PT.   Remaining deficits: See above   Education / Equipment: HEP   Patient agrees to discharge. Patient goals were partially met. Patient is being discharged due to the patient's request.  Baruch Merl, PT 12/21/22 10:19 AM

## 2022-11-09 ENCOUNTER — Encounter: Payer: BC Managed Care – PPO | Admitting: Physical Therapy

## 2022-11-15 ENCOUNTER — Encounter: Payer: BC Managed Care – PPO | Admitting: Physical Therapy

## 2022-11-22 ENCOUNTER — Ambulatory Visit: Payer: BC Managed Care – PPO | Admitting: Physical Therapy

## 2022-11-29 ENCOUNTER — Ambulatory Visit: Payer: BC Managed Care – PPO | Admitting: Physical Therapy

## 2022-12-06 ENCOUNTER — Encounter: Payer: BC Managed Care – PPO | Admitting: Physical Therapy

## 2022-12-18 ENCOUNTER — Ambulatory Visit: Payer: BC Managed Care – PPO

## 2023-01-05 ENCOUNTER — Ambulatory Visit (HOSPITAL_BASED_OUTPATIENT_CLINIC_OR_DEPARTMENT_OTHER): Payer: BC Managed Care – PPO | Admitting: Physical Therapy

## 2023-01-10 ENCOUNTER — Other Ambulatory Visit: Payer: Self-pay

## 2023-01-10 ENCOUNTER — Ambulatory Visit (HOSPITAL_BASED_OUTPATIENT_CLINIC_OR_DEPARTMENT_OTHER): Payer: BC Managed Care – PPO | Attending: Orthopedic Surgery | Admitting: Physical Therapy

## 2023-01-10 ENCOUNTER — Encounter (HOSPITAL_BASED_OUTPATIENT_CLINIC_OR_DEPARTMENT_OTHER): Payer: Self-pay | Admitting: Physical Therapy

## 2023-01-10 DIAGNOSIS — R2681 Unsteadiness on feet: Secondary | ICD-10-CM | POA: Insufficient documentation

## 2023-01-10 DIAGNOSIS — M6281 Muscle weakness (generalized): Secondary | ICD-10-CM

## 2023-01-10 NOTE — Therapy (Signed)
OUTPATIENT PHYSICAL THERAPY LOWER EXTREMITY EVALUATION   Patient Name: Audrey Bradley MRN: [id number] DOB:1966-10-10, 57 y.o., female Today's Date: 01/10/2023  END OF SESSION:  PT End of Session - 01/10/23 1841     Visit Number 1    Number of Visits 6    Date for PT Re-Evaluation 03/21/23    Authorization Type BCBS    PT Start Time 1548    PT Stop Time 1629    PT Time Calculation (min) 41 min    Activity Tolerance Patient tolerated treatment well    Behavior During Therapy WFL for tasks assessed/performed             Past Medical History:  Diagnosis Date   Anxiety    Asthma    Seasonal   Hypothyroidism    Pneumonia    PONV (postoperative nausea and vomiting)    Past Surgical History:  Procedure Laterality Date   ABDOMINAL HYSTERECTOMY     APPENDECTOMY     BARIATRIC SURGERY     lapband   CHOLECYSTECTOMY     FOOT SURGERY Left    tendon repair   MASS EXCISION     left underarm   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   Procedure: TOTAL HIP ARTHROPLASTY ANTERIOR APPROACH;  Surgeon: Gaynelle Arabian, MD;  Location: [facility];  Service: Orthopedics;  Laterality: Right;   Patient Active Problem List   Diagnosis Date Noted   Osteoarthritis of right hip 06/12/2022   Unilateral primary osteoarthritis, right hip 10/07/2020   Bilateral primary osteoarthritis of hip 09/30/2020   Morbid exogenous obesity ([place]) 09/30/2020   BMI 50.0-59.9, adult [facility]) 09/30/2020   Lapband Vanguard 11cm June 2007 [place], [place], [place]) 09/12/2013    PCP: Manfred Shirts, PA   REFERRING PROVIDER: Gaynelle Arabian, MD   REFERRING DIAG: [phone] (ICD-10-CM) - Presence of right artificial hip joint   THERAPY DIAG:  Muscle weakness (generalized)  Unsteadiness on feet  Rationale for Evaluation and Treatment: Rehabilitation  ONSET DATE: >40yrs  SUBJECTIVE:   SUBJECTIVE STATEMENT: Goal is to get rid of cane.  Walked stooped over for 2 years had to lose weight before they would  do the THR.   Hip surgery got rid of pain.  Can't walk without cane.  I try but can't go very far. I am so aggravated. [name] says I should be better but I am still very weak. 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  I have a membership here have come to the pool a few times a did what they showed me from [name].  Want to be shown what to do  PERTINENT HISTORY: Was on a walker for 3 years prior to surgery;  Right anterior THR 06/12/22;   PMH: anxiety, asthma, hypothyroidism PAIN:  Are you having pain? no  PRECAUTIONS: Fall  WEIGHT BEARING RESTRICTIONS: No  FALLS:  Has patient fallen in last 6 months? No  LIVING ENVIRONMENT: Lives with: lives with their family and lives with their spouse Lives in: House/apartment Stairs: Yes: Internal: 16 steps; on left going up Has following equipment at home: Single point cane, Walker - 2 wheeled, shower chair, and Grab bars  OCCUPATION: teacher  PLOF: Requires assistive device for independence  PATIENT GOALS: walk without cane  NEXT MD VISIT: 1 yr  OBJECTIVE:   DIAGNOSTIC FINDINGS: N/a  PATIENT SURVEYS:  FOTO primary measure 63% with goal of 70%  COGNITION: Overall cognitive status: Within functional limits for tasks assessed     SENSATION: numbness through right  lateral hip area since surgey. Pt reports area has decreased in size     POSTURE: weight shift right and right foot pronation, forward balanced weight bearing on forefoot left knee valgus deformity  PALPATION: No discomfort  LOWER EXTREMITY ROM:  wfl  LOWER EXTREMITY MMT:  MMT Right eval Left eval  Hip flexion 42.5 18.5  Hip extension 4/5 4+/5  Hip abduction 27.5 30.5  Hip adduction    Hip internal rotation    Hip external rotation    Knee flexion 46.0 41.4  Knee extension    Ankle dorsiflexion    Ankle plantarflexion    Ankle inversion    Ankle eversion     (Blank rows = not tested)   FUNCTIONAL TESTS:  5 times sit to stand: 20.65 Timed up and go (TUG): 15.46  Berg  36/56  GAIT: Distance walked: 500 Assistive device utilized: Single point cane Level of assistance: Complete Independence Comments:  flexed trunk over Rt hip, forward leaning, little heel strike   TODAY'S TREATMENT:                                                                                                                              Evaluation Objective testing Pt edu    PATIENT EDUCATION:  Education details: Discussed eval findings, rehab rationale and POC and patient is in agreement Person educated: Patient Education method: Explanation Education comprehension: verbalized understanding  HOME EXERCISE PROGRAM: Pt has land based HEP from last/recent episode Will assign aquatic  ASSESSMENT:  CLINICAL IMPRESSION: Patient is a 57 y.o. f who was seen today for physical therapy evaluation and treatment for R hip weakness/aftercare THR. She has just recently completed an episode of OPPT land based 1/24. Presents today for aquatic based skilled PT intervention for instruction on exercise program as she has now a membership here at [employer] [employer]. 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 She continues to use a cane at all times. She reports compliance with land based HEP. Has gotten access to pool and is wanting instruction on an aquatic HEP she can complete indep.  As per objective testing pt is a high risk for falls. She appears to have a slight leg length discrepancy right shorter than left.  Dr [name] informed her it would resolve on its own but she may benefit from a lift in her shoe to improve stance and balance as she tends to lean forward onto forefoot in standing.  She is a good candidate for aquatic intervention and will benefit from the properties of water to progress towards functional goals.  OBJECTIVE IMPAIRMENTS: Abnormal gait, decreased balance, decreased strength, impaired sensation, postural dysfunction, and obesity.   ACTIVITY LIMITATIONS: carrying,  lifting, standing, stairs, transfers, and reach over head  PARTICIPATION LIMITATIONS: shopping, community activity, and yard work  PERSONAL FACTORS: Fitness and Time since onset of injury/illness/exacerbation are also affecting patient's functional outcome.   REHAB POTENTIAL: Good  CLINICAL DECISION MAKING: Evolving/moderate complexity  EVALUATION COMPLEXITY: Moderate   GOALS: Goals reviewed with patient? Yes  SHORT TERM GOALS: Target date: 03/02/23 Pt will tolerate full aquatic sessions consistently without increase in pain and with improving function to demonstrate good toleration and effectiveness of intervention.   Baseline: Goal status: INITIAL  2.  Pt will be assigned an initial aquatic HEP and will report of compliance 1 x week outside of PT visits. Baseline: not assigned Goal status: INITIAL  3.  Pt will trial lift in shoe for improved leveling of LE Baseline:  Goal status: INITIAL   LONG TERM GOALS: Target date: 03/21/23  Pt to meet stated Foto Goal of 70% Baseline: 63% Goal status: INITIAL  2.  Pt will be indep with final aquatic HEP for continued management of condition  Baseline:  Goal status: INITIAL  3.  Pt will improve on Berg balance test to >/= 50/56 to demonstrate a decrease in fall risk. Baseline: 36/56 Goal status: INITIAL  4.  Pt will report no falls. Baseline:  Goal status: INITIAL    PLAN:  PT FREQUENCY: 1x week  PT DURATION: 10 weeks: total of 6 visits. (Scheduled out due to scheduling conflicts). 1 land based to assess for lift in shoe  PLANNED INTERVENTIONS: Therapeutic exercises, Therapeutic activity, Neuromuscular re-education, Balance training, Gait training, Patient/Family education, Self Care, Joint mobilization, Stair training, Orthotic/Fit training, DME instructions, Aquatic Therapy, Dry Needling, Electrical stimulation, Cryotherapy, Moist heat, Taping, Ultrasound, Ionotophoresis 4mg /ml Dexamethasone, Manual therapy, and  Re-evaluation  PLAN FOR NEXT SESSION:  initiate aquatic HEP for strengthening hips/posterior core, balance, proprioception and gait retraining.   Denton Meek, PT 01/10/2023, 7:06 PM

## 2023-02-01 ENCOUNTER — Ambulatory Visit (HOSPITAL_BASED_OUTPATIENT_CLINIC_OR_DEPARTMENT_OTHER): Payer: Self-pay | Admitting: Physical Therapy

## 2023-02-08 ENCOUNTER — Ambulatory Visit (HOSPITAL_BASED_OUTPATIENT_CLINIC_OR_DEPARTMENT_OTHER): Payer: BC Managed Care – PPO | Attending: Orthopedic Surgery | Admitting: Physical Therapy

## 2023-02-08 ENCOUNTER — Encounter (HOSPITAL_BASED_OUTPATIENT_CLINIC_OR_DEPARTMENT_OTHER): Payer: Self-pay | Admitting: Physical Therapy

## 2023-02-08 DIAGNOSIS — M25551 Pain in right hip: Secondary | ICD-10-CM | POA: Diagnosis present

## 2023-02-08 DIAGNOSIS — R2681 Unsteadiness on feet: Secondary | ICD-10-CM | POA: Diagnosis present

## 2023-02-08 DIAGNOSIS — M6281 Muscle weakness (generalized): Secondary | ICD-10-CM | POA: Insufficient documentation

## 2023-02-08 NOTE — Therapy (Signed)
OUTPATIENT PHYSICAL THERAPY LOWER EXTREMITY EVALUATION   Patient Name: Audrey Bradley MRN: [id number] DOB:1965-12-24, 57 y.o., female Today's Date: 02/08/2023  END OF SESSION:  PT End of Session - 02/08/23 1622     Visit Number 2    Number of Visits 6    Date for PT Re-Evaluation 03/21/23    Authorization Type BCBS    PT Start Time 1619    PT Stop Time 1700    PT Time Calculation (min) 41 min    Activity Tolerance Patient tolerated treatment well    Behavior During Therapy WFL for tasks assessed/performed             Past Medical History:  Diagnosis Date   Anxiety    Asthma    Seasonal   Hypothyroidism    Pneumonia    PONV (postoperative nausea and vomiting)    Past Surgical History:  Procedure Laterality Date   ABDOMINAL HYSTERECTOMY     APPENDECTOMY     BARIATRIC SURGERY     lapband   CHOLECYSTECTOMY     FOOT SURGERY Left    tendon repair   MASS EXCISION     left underarm   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   Procedure: TOTAL HIP ARTHROPLASTY ANTERIOR APPROACH;  Surgeon: Ollen Gross, MD;  Location: [facility];  Service: Orthopedics;  Laterality: Right;   Patient Active Problem List   Diagnosis Date Noted   Osteoarthritis of right hip 06/12/2022   Unilateral primary osteoarthritis, right hip 10/07/2020   Bilateral primary osteoarthritis of hip 09/30/2020   Morbid exogenous obesity 09/30/2020   BMI 50.0-59.9, adult 09/30/2020   Lapband Vanguard 11cm June 2007 [place], [place], [place]) 09/12/2013    PCP: Shellia Cleverly, PA   REFERRING PROVIDER: Ollen Gross, MD   REFERRING DIAG: [phone] (ICD-10-CM) - Presence of right artificial hip joint   THERAPY DIAG:  Muscle weakness (generalized)  Unsteadiness on feet  Pain in right hip  Rationale for Evaluation and Treatment: Rehabilitation  ONSET DATE: >36yrs  SUBJECTIVE:   SUBJECTIVE STATEMENT: "No pain. I am trying to walk without my cane."   Goal is to get rid of cane.  Walked stooped over  for 2 years had to lose weight before they would  do the THR.  Hip surgery got rid of pain.  Can't walk without cane.  I try but can't go very far. I am so aggravated. [name] says I should be better but I am still very weak. 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  I have a membership here have come to the pool a few times a did what they showed me from [place].  Want to be shown what to do  PERTINENT HISTORY: Was on a walker for 3 years prior to surgery;  Right anterior THR 06/12/22;   PMH: anxiety, asthma, hypothyroidism PAIN:  Are you having pain? no  PRECAUTIONS: Fall  WEIGHT BEARING RESTRICTIONS: No  FALLS:  Has patient fallen in last 6 months? No  LIVING ENVIRONMENT: Lives with: lives with their family and lives with their spouse Lives in: House/apartment Stairs: Yes: Internal: 16 steps; on left going up Has following equipment at home: Single point cane, Walker - 2 wheeled, shower chair, and Grab bars  OCCUPATION: teacher  PLOF: Requires assistive device for independence  PATIENT GOALS: walk without cane  NEXT MD VISIT: 1 yr  OBJECTIVE:   DIAGNOSTIC FINDINGS: N/a  PATIENT SURVEYS:  FOTO primary measure 63% with goal of 70%  COGNITION: Overall cognitive  status: Within functional limits for tasks assessed     SENSATION: numbness through right lateral hip area since surgey. Pt reports area has decreased in size     POSTURE: weight shift right and right foot pronation, forward balanced weight bearing on forefoot left knee valgus deformity  PALPATION: No discomfort  LOWER EXTREMITY ROM:  wfl  LOWER EXTREMITY MMT:  MMT Right eval Left eval  Hip flexion 42.5 18.5  Hip extension 4/5 4+/5  Hip abduction 27.5 30.5  Hip adduction    Hip internal rotation    Hip external rotation    Knee flexion 46.0 41.4  Knee extension    Ankle dorsiflexion    Ankle plantarflexion    Ankle inversion    Ankle eversion     (Blank rows = not tested)   FUNCTIONAL TESTS:  5  times sit to stand: 20.65 Timed up and go (TUG): 15.46  Berg 36/56  GAIT: Distance walked: 500 Assistive device utilized: Single point cane Level of assistance: Complete Independence Comments:  flexed trunk over Rt hip, forward leaning, little heel strike   TODAY'S TREATMENT:                                                                                                                              Pt seen for aquatic therapy today.  Treatment took place in water 3.5-4.75 ft in depth at the [facility] pool. Temp of water was 91.  Pt entered/exited the pool via stairs using step to pattern with bilat hand rail.  *forward, backward and side stepping 4.3 ft unsupported *standing stretching using noodle: add; hamstrings; gastroc and IT band R/L *Plank on step with hip extension ankle buoy rle for added hip flex stretch *Step ups leading R/L.  VC for proper execution. With decreased UE support execution improves. *forward marching with long step. Cues to exaggerate hip flex and heel strike *standing ue support wall: right ankle buoy in place add/abd; hip circles. Cues for increased speed for increased resistance. *Seated on lift: cycling x 2 mins   Pt requires the buoyancy and hydrostatic pressure of water for support, and to offload joints by unweighting joint load by at least 50 % in navel deep water and by at least 75-80% in chest to neck deep water.  Viscosity of the water is needed for resistance of strengthening. Water current perturbations provides challenge to standing balance requiring increased core activation.    PATIENT EDUCATION:  Education details: Discussed eval findings, rehab rationale and POC and patient is in agreement Person educated: Patient Education method: Explanation Education comprehension: verbalized understanding  HOME EXERCISE PROGRAM: Pt has land based HEP from last/recent episode Will assign aquatic  ASSESSMENT:  CLINICAL IMPRESSION: Pt  safe and indep in setting with therapist instructing from deck. She continues to report no right hip pain just tightness. Session initiated with stretching. Progressed to gait training and strengthening. Pt requires vs and demonstration on proper weight shift  with climbing steps. Her technique improves with instruction.  She tolerates session well without discomfort.  Pt has access to pool.  She is instructed to go 1-2 x a week as able.  Will create HEP going forward.  Patient is a 57 y.o. f who was seen today for physical therapy evaluation and treatment for R hip weakness/aftercare THR. She has just recently completed an episode of OPPT land based 1/24. Presents today for aquatic based skilled PT intervention for instruction on exercise program as she has now a membership here at [facility]. 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 She continues to use a cane at all times. She reports compliance with land based HEP. Has gotten access to pool and is wanting instruction on an aquatic HEP she can complete indep.  As per objective testing pt is a high risk for falls. She appears to have a slight leg length discrepancy right shorter than left.  Dr Despina Hick informed her it would resolve on its own but she may benefit from a lift in her shoe to improve stance and balance as she tends to lean forward onto forefoot in standing.  She is a good candidate for aquatic intervention and will benefit from the properties of water to progress towards functional goals.  OBJECTIVE IMPAIRMENTS: Abnormal gait, decreased balance, decreased strength, impaired sensation, postural dysfunction, and obesity.   ACTIVITY LIMITATIONS: carrying, lifting, standing, stairs, transfers, and reach over head  PARTICIPATION LIMITATIONS: shopping, community activity, and yard work  PERSONAL FACTORS: Fitness and Time since onset of injury/illness/exacerbation are also affecting patient's functional outcome.   REHAB  POTENTIAL: Good  CLINICAL DECISION MAKING: Evolving/moderate complexity  EVALUATION COMPLEXITY: Moderate   GOALS: Goals reviewed with patient? Yes  SHORT TERM GOALS: Target date: 03/02/23 Pt will tolerate full aquatic sessions consistently without increase in pain and with improving function to demonstrate good toleration and effectiveness of intervention.   Baseline: Goal status: INITIAL  2.  Pt will be assigned an initial aquatic HEP and will report of compliance 1 x week outside of PT visits. Baseline: not assigned Goal status: INITIAL  3.  Pt will trial lift in shoe for improved leveling of LE Baseline:  Goal status: INITIAL   LONG TERM GOALS: Target date: 03/21/23  Pt to meet stated Foto Goal of 70% Baseline: 63% Goal status: INITIAL  2.  Pt will be indep with final aquatic HEP for continued management of condition  Baseline:  Goal status: INITIAL  3.  Pt will improve on Berg balance test to >/= 50/56 to demonstrate a decrease in fall risk. Baseline: 36/56 Goal status: INITIAL  4.  Pt will report no falls. Baseline:  Goal status: INITIAL    PLAN:  PT FREQUENCY: 1x week  PT DURATION: 10 weeks: total of 6 visits. (Scheduled out due to scheduling conflicts). 1 land based to assess for lift in shoe  PLANNED INTERVENTIONS: Therapeutic exercises, Therapeutic activity, Neuromuscular re-education, Balance training, Gait training, Patient/Family education, Self Care, Joint mobilization, Stair training, Orthotic/Fit training, DME instructions, Aquatic Therapy, Dry Needling, Electrical stimulation, Cryotherapy, Moist heat, Taping, Ultrasound, Ionotophoresis /ml Dexamethasone, Manual therapy, and Re-evaluation  PLAN FOR NEXT SESSION:  initiate aquatic HEP for strengthening hips/posterior core, balance, proprioception and gait retraining.   Geni Bers, PT 02/08/2023, 4:22 PM

## 2023-02-14 ENCOUNTER — Ambulatory Visit (HOSPITAL_BASED_OUTPATIENT_CLINIC_OR_DEPARTMENT_OTHER): Payer: Self-pay | Admitting: Physical Therapy

## 2023-02-15 ENCOUNTER — Ambulatory Visit (HOSPITAL_BASED_OUTPATIENT_CLINIC_OR_DEPARTMENT_OTHER): Payer: BC Managed Care – PPO | Admitting: Physical Therapy

## 2023-02-22 ENCOUNTER — Encounter (HOSPITAL_BASED_OUTPATIENT_CLINIC_OR_DEPARTMENT_OTHER): Payer: Self-pay | Admitting: Physical Therapy

## 2023-02-22 ENCOUNTER — Ambulatory Visit (HOSPITAL_BASED_OUTPATIENT_CLINIC_OR_DEPARTMENT_OTHER): Payer: BC Managed Care – PPO | Admitting: Physical Therapy

## 2023-02-22 ENCOUNTER — Ambulatory Visit (HOSPITAL_BASED_OUTPATIENT_CLINIC_OR_DEPARTMENT_OTHER): Payer: BC Managed Care – PPO | Attending: Orthopedic Surgery | Admitting: Physical Therapy

## 2023-02-22 DIAGNOSIS — R2681 Unsteadiness on feet: Secondary | ICD-10-CM | POA: Diagnosis present

## 2023-02-22 DIAGNOSIS — M6281 Muscle weakness (generalized): Secondary | ICD-10-CM | POA: Diagnosis present

## 2023-02-22 DIAGNOSIS — R293 Abnormal posture: Secondary | ICD-10-CM

## 2023-02-22 DIAGNOSIS — M25551 Pain in right hip: Secondary | ICD-10-CM | POA: Diagnosis present

## 2023-02-22 NOTE — Therapy (Signed)
OUTPATIENT PHYSICAL THERAPY LOWER EXTREMITY TREATMENT   Patient Name: Audrey Bradley MRN: [id number] DOB:October 03, 1966, 57 y.o., female Today's Date: 02/22/2023  END OF SESSION:  PT End of Session - 02/22/23 1625     Visit Number 3    Number of Visits 6    Date for PT Re-Evaluation 03/21/23    Authorization Type BCBS    PT Start Time 1619    PT Stop Time 1700    PT Time Calculation (min) 41 min    Activity Tolerance Patient tolerated treatment well    Behavior During Therapy WFL for tasks assessed/performed             Past Medical History:  Diagnosis Date   Anxiety    Asthma    Seasonal   Hypothyroidism    Pneumonia    PONV (postoperative nausea and vomiting)    Past Surgical History:  Procedure Laterality Date   ABDOMINAL HYSTERECTOMY     APPENDECTOMY     BARIATRIC SURGERY     lapband   CHOLECYSTECTOMY     FOOT SURGERY Left    tendon repair   MASS EXCISION     left underarm   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   Procedure: TOTAL HIP ARTHROPLASTY ANTERIOR APPROACH;  Surgeon: Ollen Gross, MD;  Location: [facility];  Service: Orthopedics;  Laterality: Right;   Patient Active Problem List   Diagnosis Date Noted   Osteoarthritis of right hip 06/12/2022   Unilateral primary osteoarthritis, right hip 10/07/2020   Bilateral primary osteoarthritis of hip 09/30/2020   Morbid exogenous obesity (HCC) 09/30/2020   BMI 50.0-59.9, adult [facility]) 09/30/2020   Lapband Vanguard 11cm June 2007 [place], [place], [place]) 09/12/2013    PCP: Shellia Cleverly, PA   REFERRING PROVIDER: Ollen Gross, MD   REFERRING DIAG: [phone] (ICD-10-CM) - Presence of right artificial hip joint   THERAPY DIAG:  Muscle weakness (generalized)  Unsteadiness on feet  Pain in right hip  Abnormal posture  Rationale for Evaluation and Treatment: Rehabilitation  ONSET DATE: >83yrs  SUBJECTIVE:   SUBJECTIVE STATEMENT: Pt reports she was a little sore for one day after last session.  She  had to miss therapy last week due to trip to [place] for niece's bridal shower; wedding June 1.    PERTINENT HISTORY: Was on a walker for 3 years prior to surgery;  Right anterior THR 06/12/22;   PMH: anxiety, asthma, hypothyroidism PAIN:  Are you having pain? No  - just tight  PRECAUTIONS: Fall  WEIGHT BEARING RESTRICTIONS: No  FALLS:  Has patient fallen in last 6 months? No  LIVING ENVIRONMENT: Lives with: lives with their family and lives with their spouse Lives in: House/apartment Stairs: Yes: Internal: 16 steps; on left going up Has following equipment at home: Single point cane, Walker - 2 wheeled, shower chair, and Grab bars  OCCUPATION: teacher  PLOF: Requires assistive device for independence  PATIENT GOALS: walk without cane  NEXT MD VISIT: 1 yr  OBJECTIVE:   DIAGNOSTIC FINDINGS: N/a  PATIENT SURVEYS:  FOTO primary measure 63% with goal of 70%  COGNITION: Overall cognitive status: Within functional limits for tasks assessed     SENSATION: numbness through right lateral hip area since surgey. Pt reports area has decreased in size     POSTURE: weight shift right and right foot pronation, forward balanced weight bearing on forefoot left knee valgus deformity  PALPATION: No discomfort  LOWER EXTREMITY ROM:  wfl  LOWER EXTREMITY MMT:  MMT Right eval Left eval  Hip flexion 42.5 18.5  Hip extension 4/5 4+/5  Hip abduction 27.5 30.5  Hip adduction    Hip internal rotation    Hip external rotation    Knee flexion 46.0 41.4  Knee extension    Ankle dorsiflexion    Ankle plantarflexion    Ankle inversion    Ankle eversion     (Blank rows = not tested)   FUNCTIONAL TESTS:  5 times sit to stand: 20.65 Timed up and go (TUG): 15.46  Berg 36/56  GAIT: Distance walked: 500 Assistive device utilized: Single point cane Level of assistance: Complete Independence Comments:  flexed trunk over Rt hip, forward leaning, little heel strike   TODAY'S  TREATMENT:                                                                                                                              Pt seen for aquatic therapy today.  Treatment took place in water 3.5-4.75 ft in depth at the [facility] pool. Temp of water was 91.  Pt entered/exited the pool via stairs using step to pattern with bilat hand rail.  *forward, backward and side stepping unsupported, cues for purposeful movement, reduce step length, upright posture * without support: SLS x 15s each LE,   SLS with 3 way toe touch x 7 each LE * high knee marching with reciprocal arm swing * holding wall:  single leg clams x 10 each  * sitting on yellow noodle:  cycling with breast stroke arms;  cc ski;  suspended jumping jacks  * holding yellow noodle:  bilat heel raises x 10; single leg x 5 each;  LE swings into hip flex/ext; hip abdct/ addct x 10 * at stairs:  fig 4; hamstring stretches (foot on 3rd step); Rt hip flexor stretch   Pt requires the buoyancy and hydrostatic pressure of water for support, and to offload joints by unweighting joint load by at least 50 % in navel deep water and by at least 75-80% in chest to neck deep water.  Viscosity of the water is needed for resistance of strengthening. Water current perturbations provides challenge to standing balance requiring increased core activation.    PATIENT EDUCATION:  Education details: aquatic progressions and modifications  Person educated: Patient Education method: Explanation Education comprehension: verbalized understanding  HOME EXERCISE PROGRAM: Pt has land based HEP from last/recent episode Will assign aquatic  ASSESSMENT:  CLINICAL IMPRESSION: Pt tolerated all exercises well without increase in pain. She requires occasional cues for form and mindful movement (especially with gait, to correct pattern).  Pt is a member of [employer]; and plans to come more frequently to use pool once school is done for year.  Will  create HEP going forward.  Goals are ongoing.       From eval:  Patient is a 57 y.o. f who was seen today for physical therapy evaluation and treatment for R  hip weakness/aftercare THR. She has just recently completed an episode of OPPT land based 1/24. Presents today for aquatic based skilled PT intervention for instruction on exercise program as she has now a membership here at [employer]. 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 She continues to use a cane at all times. She reports compliance with land based HEP. Has gotten access to pool and is wanting instruction on an aquatic HEP she can complete indep.  As per objective testing pt is a high risk for falls. She appears to have a slight leg length discrepancy right shorter than left.  Dr Despina Hick informed her it would resolve on its own but she may benefit from a lift in her shoe to improve stance and balance as she tends to lean forward onto forefoot in standing.  She is a good candidate for aquatic intervention and will benefit from the properties of water to progress towards functional goals.  OBJECTIVE IMPAIRMENTS: Abnormal gait, decreased balance, decreased strength, impaired sensation, postural dysfunction, and obesity.   ACTIVITY LIMITATIONS: carrying, lifting, standing, stairs, transfers, and reach over head  PARTICIPATION LIMITATIONS: shopping, community activity, and yard work  PERSONAL FACTORS: Fitness and Time since onset of injury/illness/exacerbation are also affecting patient's functional outcome.   REHAB POTENTIAL: Good  CLINICAL DECISION MAKING: Evolving/moderate complexity  EVALUATION COMPLEXITY: Moderate   GOALS: Goals reviewed with patient? Yes  SHORT TERM GOALS: Target date: 03/02/23 Pt will tolerate full aquatic sessions consistently without increase in pain and with improving function to demonstrate good toleration and effectiveness of intervention.   Baseline: Goal status: INITIAL  2.  Pt  will be assigned an initial aquatic HEP and will report of compliance 1 x week outside of PT visits. Baseline: not assigned Goal status: INITIAL  3.  Pt will trial lift in shoe for improved leveling of LE Baseline:  Goal status: INITIAL   LONG TERM GOALS: Target date: 03/21/23  Pt to meet stated Foto Goal of 70% Baseline: 63% Goal status: INITIAL  2.  Pt will be indep with final aquatic HEP for continued management of condition  Baseline:  Goal status: INITIAL  3.  Pt will improve on Berg balance test to >/= 50/56 to demonstrate a decrease in fall risk. Baseline: 36/56 Goal status: INITIAL  4.  Pt will report no falls. Baseline:  Goal status: INITIAL    PLAN:  PT FREQUENCY: 1x week  PT DURATION: 10 weeks: total of 6 visits. (Scheduled out due to scheduling conflicts). 1 land based to assess for lift in shoe  PLANNED INTERVENTIONS: Therapeutic exercises, Therapeutic activity, Neuromuscular re-education, Balance training, Gait training, Patient/Family education, Self Care, Joint mobilization, Stair training, Orthotic/Fit training, DME instructions, Aquatic Therapy, Dry Needling, Electrical stimulation, Cryotherapy, Moist heat, Taping, Ultrasound, Ionotophoresis 4mg /ml Dexamethasone, Manual therapy, and Re-evaluation  PLAN FOR NEXT SESSION:  initiate aquatic HEP for strengthening hips/posterior core, balance, proprioception and gait retraining.

## 2023-03-01 ENCOUNTER — Ambulatory Visit (HOSPITAL_BASED_OUTPATIENT_CLINIC_OR_DEPARTMENT_OTHER): Payer: BC Managed Care – PPO | Admitting: Physical Therapy

## 2023-03-01 ENCOUNTER — Encounter (HOSPITAL_BASED_OUTPATIENT_CLINIC_OR_DEPARTMENT_OTHER): Payer: Self-pay | Admitting: Physical Therapy

## 2023-03-01 DIAGNOSIS — M25551 Pain in right hip: Secondary | ICD-10-CM

## 2023-03-01 DIAGNOSIS — R2681 Unsteadiness on feet: Secondary | ICD-10-CM

## 2023-03-01 DIAGNOSIS — M6281 Muscle weakness (generalized): Secondary | ICD-10-CM

## 2023-03-01 DIAGNOSIS — R293 Abnormal posture: Secondary | ICD-10-CM

## 2023-03-01 NOTE — Therapy (Signed)
OUTPATIENT PHYSICAL THERAPY LOWER EXTREMITY TREATMENT   Patient Name: Audrey Bradley MRN: [id number] DOB:March 25, 1966, 57 y.o., female Today's Date: 03/01/2023  END OF SESSION:  PT End of Session - 03/01/23 1629     Visit Number 4    Number of Visits 6    Date for PT Re-Evaluation 03/21/23    Authorization Type BCBS    PT Start Time 1616    PT Stop Time 1700    PT Time Calculation (min) 44 min    Activity Tolerance Patient tolerated treatment well    Behavior During Therapy WFL for tasks assessed/performed             Past Medical History:  Diagnosis Date   Anxiety    Asthma    Seasonal   Hypothyroidism    Pneumonia    PONV (postoperative nausea and vomiting)    Past Surgical History:  Procedure Laterality Date   ABDOMINAL HYSTERECTOMY     APPENDECTOMY     BARIATRIC SURGERY     lapband   CHOLECYSTECTOMY     FOOT SURGERY Left    tendon repair   MASS EXCISION     left underarm   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   Procedure: TOTAL HIP ARTHROPLASTY ANTERIOR APPROACH;  Surgeon: Ollen Gross, MD;  Location: [facility];  Service: Orthopedics;  Laterality: Right;   Patient Active Problem List   Diagnosis Date Noted   Osteoarthritis of right hip 06/12/2022   Unilateral primary osteoarthritis, right hip 10/07/2020   Bilateral primary osteoarthritis of hip 09/30/2020   Morbid exogenous obesity (HCC) 09/30/2020   BMI 50.0-59.9, adult [facility]) 09/30/2020   Lapband Vanguard 11cm June 2007 [place], [place], [place]) 09/12/2013    PCP: Shellia Cleverly, PA   REFERRING PROVIDER: Ollen Gross, MD   REFERRING DIAG: [id number] (ICD-10-CM) - Presence of right artificial hip joint   THERAPY DIAG:  Muscle weakness (generalized)  Unsteadiness on feet  Pain in right hip  Abnormal posture  Rationale for Evaluation and Treatment: Rehabilitation  ONSET DATE: >53yrs  SUBJECTIVE:   SUBJECTIVE STATEMENT: Pt reports she has low pain and felt very good after last session.  Walking with a cane.   PERTINENT HISTORY: Was on a walker for 3 years prior to surgery;  Right anterior THR 06/12/22;   PMH: anxiety, asthma, hypothyroidism PAIN:  Are you having pain? No  - just tight  PRECAUTIONS: Fall  WEIGHT BEARING RESTRICTIONS: No  FALLS:  Has patient fallen in last 6 months? No  LIVING ENVIRONMENT: Lives with: lives with their family and lives with their spouse Lives in: House/apartment Stairs: Yes: Internal: 16 steps; on left going up Has following equipment at home: Single point cane, Walker - 2 wheeled, shower chair, and Grab bars  OCCUPATION: teacher  PLOF: Requires assistive device for independence  PATIENT GOALS: walk without cane  NEXT MD VISIT: 1 yr  OBJECTIVE:   DIAGNOSTIC FINDINGS: N/a  PATIENT SURVEYS:  FOTO primary measure 63% with goal of 70%  COGNITION: Overall cognitive status: Within functional limits for tasks assessed     SENSATION: numbness through right lateral hip area since surgey. Pt reports area has decreased in size     POSTURE: weight shift right and right foot pronation, forward balanced weight bearing on forefoot left knee valgus deformity  PALPATION: No discomfort  LOWER EXTREMITY ROM:  wfl  LOWER EXTREMITY MMT:  MMT Right eval Left eval  Hip flexion 42.5 18.5  Hip extension 4/5 4+/5  Hip  abduction 27.5 30.5  Hip adduction    Hip internal rotation    Hip external rotation    Knee flexion 46.0 41.4  Knee extension    Ankle dorsiflexion    Ankle plantarflexion    Ankle inversion    Ankle eversion     (Blank rows = not tested)   FUNCTIONAL TESTS:  5 times sit to stand: 20.65 Timed up and go (TUG): 15.46  Berg 36/56  GAIT: Distance walked: 500 Assistive device utilized: Single point cane Level of assistance: Complete Independence Comments:  flexed trunk over Rt hip, forward leaning, little heel strike   TODAY'S TREATMENT:                                                                                                                               Pt seen for aquatic therapy today.  Treatment took place in water 3.5-4.75 ft in depth at the [facility] pool. Temp of water was 91.  Pt entered/exited the pool via stairs using step to pattern with bilat hand rail.  *forward, backward and side stepping unsupported, cues for purposeful movement, reduce step length, upright posture * without support: SLS x 15s each LE,   SLS with 3 way toe touch x 7 each LE; hip flex/ext x 10; bilat heel raises and bilat toe raises x 10; single leg x 5 each * sitting on yellow noodle:  cycling with breast stroke arms;  cc ski;  suspended jumping jacks *Straddling noodle in 3.8 ft jumping jacks then reverse jumping jacks  *bilat hip and knee /quad stretching on step *Noodle kickdown  with hip in neutral and then external rotation slow x 10 then quick both positions. Stair climbing instruction exiting pool.  VC for weight shift.  Pt requires the buoyancy and hydrostatic pressure of water for support, and to offload joints by unweighting joint load by at least 50 % in navel deep water and by at least 75-80% in chest to neck deep water.  Viscosity of the water is needed for resistance of strengthening. Water current perturbations provides challenge to standing balance requiring increased core activation.    PATIENT EDUCATION:  Education details: aquatic progressions and modifications  Person educated: Patient Education method: Explanation Education comprehension: verbalized understanding  HOME EXERCISE PROGRAM: Pt has land based HEP from last/recent episode  Access Code: [email or web address] URL: [email or web address] Date: 03/01/2023 Prepared by: Geni Bers  This aquatic home exercise program from [employer] utilizes pictures from land based exercises, but has been adapted prior to lamination and issuance.   Exercises - Hand Buoy Carry  - 1 x daily - 7 x weekly - 3 sets - 10  reps - Standing Hip Abduction  - 1 x daily - 7 x weekly - 3 sets - 10 reps - Seated Straddle on Flotation Forward Breast Stroke Arms and Bicycle Legs  - 1 x daily - 7 x weekly - 3 sets - 10 reps - Standing  3-Way Leg Reach  - 1 x daily - 7 x weekly - 3 sets - 10 reps - Noodle Step Down  - 1 x daily - 7 x weekly - 3 sets - 10 reps - Standing Hip Flexion Extension at [employer]  - 1 x daily - 7 x weekly - 3 sets - 10 reps - Water Step Up on Bottom Step  - 1 x daily - 7 x weekly - 3 sets - 10 reps - Squat    ASSESSMENT:  CLINICAL IMPRESSION: Pt reports completing aquatic exercises as committed to memory a few times in last few weeks.  Her pain is low and she reports very good response to last aquatic session with no pain and improved gait for a few days.  She tolerates progressed session today with added exercises and reps as well as decreased UE support.  Plan to laminate after final creation of HEP to issue next visit.  She is progressing well. Note: work stair climbing next session.    From eval:  Patient is a 57 y.o. f who was seen today for physical therapy evaluation and treatment for R hip weakness/aftercare THR. She has just recently completed an episode of OPPT land based 1/24. Presents today for aquatic based skilled PT intervention for instruction on exercise program as she has now a membership here at [employer]. 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 She continues to use a cane at all times. She reports compliance with land based HEP. Has gotten access to pool and is wanting instruction on an aquatic HEP she can complete indep.  As per objective testing pt is a high risk for falls. She appears to have a slight leg length discrepancy right shorter than left.  Dr Despina Hick informed her it would resolve on its own but she may benefit from a lift in her shoe to improve stance and balance as she tends to lean forward onto forefoot in standing.  She is a good candidate for  aquatic intervention and will benefit from the properties of water to progress towards functional goals.  OBJECTIVE IMPAIRMENTS: Abnormal gait, decreased balance, decreased strength, impaired sensation, postural dysfunction, and obesity.   ACTIVITY LIMITATIONS: carrying, lifting, standing, stairs, transfers, and reach over head  PARTICIPATION LIMITATIONS: shopping, community activity, and yard work  PERSONAL FACTORS: Fitness and Time since onset of injury/illness/exacerbation are also affecting patient's functional outcome.   REHAB POTENTIAL: Good  CLINICAL DECISION MAKING: Evolving/moderate complexity  EVALUATION COMPLEXITY: Moderate   GOALS: Goals reviewed with patient? Yes  SHORT TERM GOALS: Target date: 03/02/23 Pt will tolerate full aquatic sessions consistently without increase in pain and with improving function to demonstrate good toleration and effectiveness of intervention.   Baseline: Goal status: Met 03/01/23  2.  Pt will be assigned an initial aquatic HEP and will report of compliance 1 x week outside of PT visits. Baseline: not assigned Goal status: Met 03/01/23  3.  Pt will trial lift in shoe for improved leveling of LE Baseline:  Goal status: INITIAL   LONG TERM GOALS: Target date: 03/21/23  Pt to meet stated Foto Goal of 70% Baseline: 63% Goal status: INITIAL  2.  Pt will be indep with final aquatic HEP for continued management of condition  Baseline:  Goal status: INITIAL  3.  Pt will improve on Berg balance test to >/= 50/56 to demonstrate a decrease in fall risk. Baseline: 36/56 Goal status: INITIAL  4.  Pt will  report no falls. Baseline:  Goal status: INITIAL    PLAN:  PT FREQUENCY: 1x week  PT DURATION: 10 weeks: total of 6 visits. (Scheduled out due to scheduling conflicts). 1 land based to assess for lift in shoe  PLANNED INTERVENTIONS: Therapeutic exercises, Therapeutic activity, Neuromuscular re-education, Balance training, Gait  training, Patient/Family education, Self Care, Joint mobilization, Stair training, Orthotic/Fit training, DME instructions, Aquatic Therapy, Dry Needling, Electrical stimulation, Cryotherapy, Moist heat, Taping, Ultrasound, Ionotophoresis 4mg /ml Dexamethasone, Manual therapy, and Re-evaluation  PLAN FOR NEXT SESSION:  initiate aquatic HEP for strengthening hips/posterior core, balance, proprioception and gait retraining.

## 2023-03-08 ENCOUNTER — Encounter (HOSPITAL_BASED_OUTPATIENT_CLINIC_OR_DEPARTMENT_OTHER): Payer: Self-pay | Admitting: Physical Therapy

## 2023-03-08 ENCOUNTER — Ambulatory Visit (HOSPITAL_BASED_OUTPATIENT_CLINIC_OR_DEPARTMENT_OTHER): Payer: BC Managed Care – PPO | Admitting: Physical Therapy

## 2023-03-08 DIAGNOSIS — R293 Abnormal posture: Secondary | ICD-10-CM

## 2023-03-08 DIAGNOSIS — M6281 Muscle weakness (generalized): Secondary | ICD-10-CM

## 2023-03-08 DIAGNOSIS — M25551 Pain in right hip: Secondary | ICD-10-CM

## 2023-03-08 NOTE — Therapy (Signed)
OUTPATIENT PHYSICAL THERAPY LOWER EXTREMITY TREATMENT   Patient Name: Audrey Bradley MRN: [id number] DOB:07-Nov-1965, 57 y.o., female Today's Date: 03/08/2023  END OF SESSION:  PT End of Session - 03/08/23 1617     Visit Number 5    Number of Visits 6    Date for PT Re-Evaluation 03/21/23    Authorization Type BCBS    PT Start Time 1617    PT Stop Time 1700    PT Time Calculation (min) 43 min    Activity Tolerance Patient tolerated treatment well    Behavior During Therapy WFL for tasks assessed/performed             Past Medical History:  Diagnosis Date   Anxiety    Asthma    Seasonal   Hypothyroidism    Pneumonia    PONV (postoperative nausea and vomiting)    Past Surgical History:  Procedure Laterality Date   ABDOMINAL HYSTERECTOMY     APPENDECTOMY     BARIATRIC SURGERY     lapband   CHOLECYSTECTOMY     FOOT SURGERY Left    tendon repair   MASS EXCISION     left underarm   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   Procedure: TOTAL HIP ARTHROPLASTY ANTERIOR APPROACH;  Surgeon: Ollen Gross, MD;  Location: [facility];  Service: Orthopedics;  Laterality: Right;   Patient Active Problem List   Diagnosis Date Noted   Osteoarthritis of right hip 06/12/2022   Unilateral primary osteoarthritis, right hip 10/07/2020   Bilateral primary osteoarthritis of hip 09/30/2020   Morbid exogenous obesity (HCC) 09/30/2020   BMI 50.0-59.9, adult [facility]) 09/30/2020   Lapband Vanguard 11cm June 2007 [place], [place], [place]) 09/12/2013    PCP: Shellia Cleverly, PA   REFERRING PROVIDER: Ollen Gross, MD   REFERRING DIAG: [phone] (ICD-10-CM) - Presence of right artificial hip joint   THERAPY DIAG:  Muscle weakness (generalized)  Pain in right hip  Abnormal posture  Rationale for Evaluation and Treatment: Rehabilitation  ONSET DATE: >56yrs  SUBJECTIVE:   SUBJECTIVE STATEMENT: Pt reports she has low pain and felt very good after last session. Walking with a  cane.   PERTINENT HISTORY: Was on a walker for 3 years prior to surgery;  Right anterior THR 06/12/22;   PMH: anxiety, asthma, hypothyroidism PAIN:  Are you having pain? No  - just tight  PRECAUTIONS: Fall  WEIGHT BEARING RESTRICTIONS: No  FALLS:  Has patient fallen in last 6 months? No  LIVING ENVIRONMENT: Lives with: lives with their family and lives with their spouse Lives in: House/apartment Stairs: Yes: Internal: 16 steps; on left going up Has following equipment at home: Single point cane, Walker - 2 wheeled, shower chair, and Grab bars  OCCUPATION: teacher  PLOF: Requires assistive device for independence  PATIENT GOALS: walk without cane  NEXT MD VISIT: 1 yr  OBJECTIVE:   DIAGNOSTIC FINDINGS: N/a  PATIENT SURVEYS:  FOTO primary measure 63% with goal of 70%  COGNITION: Overall cognitive status: Within functional limits for tasks assessed     SENSATION: numbness through right lateral hip area since surgey. Pt reports area has decreased in size     POSTURE: weight shift right and right foot pronation, forward balanced weight bearing on forefoot left knee valgus deformity  PALPATION: No discomfort  LOWER EXTREMITY ROM:  wfl  LOWER EXTREMITY MMT:  MMT Right eval Left eval  Hip flexion 42.5 18.5  Hip extension 4/5 4+/5  Hip abduction 27.5 30.5  Hip adduction    Hip internal rotation    Hip external rotation    Knee flexion 46.0 41.4  Knee extension    Ankle dorsiflexion    Ankle plantarflexion    Ankle inversion    Ankle eversion     (Blank rows = not tested)   FUNCTIONAL TESTS:  5 times sit to stand: 20.65 Timed up and go (TUG): 15.46  Berg 36/56  GAIT: Distance walked: 500 Assistive device utilized: Single point cane Level of assistance: Complete Independence Comments:  flexed trunk over Rt hip, forward leaning, little heel strike   TODAY'S TREATMENT:                                                                                                                               Pt seen for aquatic therapy today.  Treatment took place in water 3.5-4.75 ft in depth at the [facility] pool. Temp of water was 91.  Pt entered/exited the pool via stairs using step to pattern with bilat hand rail.   Exercises completed: - Hand Buoy Carry  - Standing Hip Abduction  - 1 x daily - 1-2 sets - 10 reps - Standing 3-Way Leg Reach  - 1 x daily - 1-2 sets - 10 reps - Standing Hip Flexion Extension at [employer]  - 1-2 sets - 10 reps - Noodle Step Down  - 2 sets - 10 reps - Water Step Up on Bottom Step  - 1-2 sets - 10 reps - Squat  - 1 x daily - 1-3 sets - 10 reps - Seated Straddle on Flotation Forward Breast Stroke Arms and Bicycle Legs  - Sit to Stand  - 1-2 sets - 10 reps - Flutter Kicking/Windshield Wipers  - 1-2 sets - 10 reps  Pt requires the buoyancy and hydrostatic pressure of water for support, and to offload joints by unweighting joint load by at least 50 % in navel deep water and by at least 75-80% in chest to neck deep water.  Viscosity of the water is needed for resistance of strengthening. Water current perturbations provides challenge to standing balance requiring increased core activation.    PATIENT EDUCATION:  Education details: aquatic progressions and modifications  Person educated: Patient Education method: Explanation Education comprehension: verbalized understanding  HOME EXERCISE PROGRAM: Pt has land based HEP from last/recent episode  Access Code: [email or web address] URL: [email or web address] Date: 03/01/2023 Prepared by: Geni Bers  This aquatic home exercise program from Med[employer] utilizes pictures from land based exercises, but has been adapted prior to lamination and issuance.   Exercises - Hand Buoy Carry   - Standing Hip Abduction  2 sets - 10 reps - Seated Straddle on Flotation Forward Breast Stroke Arms and Bicycle Legs   - Standing 3-Way Leg Reach 2 x 10 R/L - Noodle  Step Down  - hip neutral then externally rotated x 10 ea 2 sets fast then slow - Standing Hip Flexion Extension  at [facility]  2 x 10 - Water Step Up on Bottom Step  -Leading R/L x 10  - Squat    ASSESSMENT:  CLINICAL IMPRESSION: Pt at final aquatic session. She is instructed through HEP requiring vc and demonstration. She is limited some due to right peroneal cramping intermittently throughout session. She is using cane at all times, progressed past fww. No pain other than cramp. She could not tolerate stair climbing today as intended to do. Plan to see her land based next session for heel lift assessment and insertion to improve gait. Will issue HEP. Likely DC at that time. She does have a membership here at [facility] to be able to complete final aquatic HEP.       From eval:  Patient is a 57 y.o. f who was seen today for physical therapy evaluation and treatment for R hip weakness/aftercare THR. She has just recently completed an episode of OPPT land based 1/24. Presents today for aquatic based skilled PT intervention for instruction on exercise program as she has now a membership here at [facility]. 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 She continues to use a cane at all times. She reports compliance with land based HEP. Has gotten access to pool and is wanting instruction on an aquatic HEP she can complete indep.  As per objective testing pt is a high risk for falls. She appears to have a slight leg length discrepancy right shorter than left.  Dr Despina Hick informed her it would resolve on its own but she may benefit from a lift in her shoe to improve stance and balance as she tends to lean forward onto forefoot in standing.  She is a good candidate for aquatic intervention and will benefit from the properties of water to progress towards functional goals.  OBJECTIVE IMPAIRMENTS: Abnormal gait, decreased balance, decreased strength, impaired sensation, postural dysfunction,  and obesity.   ACTIVITY LIMITATIONS: carrying, lifting, standing, stairs, transfers, and reach over head  PARTICIPATION LIMITATIONS: shopping, community activity, and yard work  PERSONAL FACTORS: Fitness and Time since onset of injury/illness/exacerbation are also affecting patient's functional outcome.   REHAB POTENTIAL: Good  CLINICAL DECISION MAKING: Evolving/moderate complexity  EVALUATION COMPLEXITY: Moderate   GOALS: Goals reviewed with patient? Yes  SHORT TERM GOALS: Target date: 03/02/23 Pt will tolerate full aquatic sessions consistently without increase in pain and with improving function to demonstrate good toleration and effectiveness of intervention.   Baseline: Goal status: Met 03/01/23  2.  Pt will be assigned an initial aquatic HEP and will report of compliance 1 x week outside of PT visits. Baseline: not assigned Goal status: Met 03/01/23  3.  Pt will trial lift in shoe for improved leveling of LE Baseline:  Goal status: INITIAL   LONG TERM GOALS: Target date: 03/21/23  Pt to meet stated Foto Goal of 70% Baseline: 63% Goal status: INITIAL  2.  Pt will be indep with final aquatic HEP for continued management of condition  Baseline:  Goal status: INITIAL  3.  Pt will improve on Berg balance test to >/= 50/56 to demonstrate a decrease in fall risk. Baseline: 36/56 Goal status: INITIAL  4.  Pt will report no falls. Baseline:  Goal status: INITIAL    PLAN:  PT FREQUENCY: 1x week  PT DURATION: 10 weeks: total of 6 visits. (Scheduled out due to scheduling conflicts). 1 land based to assess for lift in shoe  PLANNED INTERVENTIONS: Therapeutic exercises, Therapeutic activity,  Neuromuscular re-education, Balance training, Gait training, Patient/Family education, Self Care, Joint mobilization, Stair training, Orthotic/Fit training, DME instructions, Aquatic Therapy, Dry Needling, Electrical stimulation, Cryotherapy, Moist heat, Taping, Ultrasound,  Ionotophoresis 4mg /ml Dexamethasone, Manual therapy, and Re-evaluation  PLAN FOR NEXT SESSION:  heel lift, objective testing; dc

## 2023-03-10 ENCOUNTER — Ambulatory Visit (HOSPITAL_BASED_OUTPATIENT_CLINIC_OR_DEPARTMENT_OTHER): Payer: BC Managed Care – PPO | Admitting: Physical Therapy

## 2023-03-10 ENCOUNTER — Encounter (HOSPITAL_BASED_OUTPATIENT_CLINIC_OR_DEPARTMENT_OTHER): Payer: Self-pay | Admitting: Physical Therapy

## 2023-03-10 DIAGNOSIS — R293 Abnormal posture: Secondary | ICD-10-CM

## 2023-03-10 DIAGNOSIS — M6281 Muscle weakness (generalized): Secondary | ICD-10-CM | POA: Diagnosis not present

## 2023-03-10 DIAGNOSIS — M25551 Pain in right hip: Secondary | ICD-10-CM

## 2023-03-10 DIAGNOSIS — R2681 Unsteadiness on feet: Secondary | ICD-10-CM

## 2023-03-10 NOTE — Therapy (Signed)
OUTPATIENT PHYSICAL THERAPY LOWER EXTREMITY  PHYSICAL THERAPY DISCHARGE SUMMARY  Visits from Start of Care: 6  Current functional level related to goals / functional outcomes: Indep with functional mobility and adl's with ad   Remaining deficits: Leg length difference   Education / Equipment: Management of condition/ HEP   Patient agrees to discharge. Patient goals were partially met. Patient is being discharged due to maximized rehab potential.     Patient Name: Audrey Bradley MRN: [id number] DOB:[birth year], 57 y.o., female Today's Date: 03/10/2023  END OF SESSION:  PT End of Session - 03/10/23 1044     Visit Number 6    Number of Visits 6    Date for PT Re-Evaluation 03/21/23    Authorization Type BCBS    PT Start Time 0950    PT Stop Time 1030    PT Time Calculation (min) 40 min    Activity Tolerance Patient tolerated treatment well    Behavior During Therapy WFL for tasks assessed/performed             Past Medical History:  Diagnosis Date   Anxiety    Asthma    Seasonal   Hypothyroidism    Pneumonia    PONV (postoperative nausea and vomiting)    Past Surgical History:  Procedure Laterality Date   ABDOMINAL HYSTERECTOMY     APPENDECTOMY     BARIATRIC SURGERY     lapband   CHOLECYSTECTOMY     FOOT SURGERY Left    tendon repair   MASS EXCISION     left underarm   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   Procedure: TOTAL HIP ARTHROPLASTY ANTERIOR APPROACH;  Surgeon: Ollen Gross, MD;  Location: [facility];  Service: Orthopedics;  Laterality: Right;   Patient Active Problem List   Diagnosis Date Noted   Osteoarthritis of right hip 06/12/2022   Unilateral primary osteoarthritis, right hip 10/07/2020   Bilateral primary osteoarthritis of hip 09/30/2020   Morbid exogenous obesity (HCC) 09/30/2020   BMI 50.0-59.9, adult [facility]) 09/30/2020   Lapband Vanguard 11cm June 2007 [place], [place], [place]) 09/12/2013    PCP: Shellia Cleverly, PA   REFERRING  PROVIDER: Ollen Gross, MD   REFERRING DIAG: [phone] (ICD-10-CM) - Presence of right artificial hip joint   THERAPY DIAG:  Pain in right hip  Abnormal posture  Muscle weakness (generalized)  Unsteadiness on feet  Rationale for Evaluation and Treatment: Rehabilitation  ONSET DATE: >50yrs  SUBJECTIVE:   SUBJECTIVE STATEMENT: Pt reports she has low pain and felt very good after last session. Walking with a cane.   PERTINENT HISTORY: Was on a walker for 3 years prior to surgery;  Right anterior THR 06/12/22;   PMH: anxiety, asthma, hypothyroidism PAIN:  Are you having pain? No  - just tight  PRECAUTIONS: Fall  WEIGHT BEARING RESTRICTIONS: No  FALLS:  Has patient fallen in last 6 months? No  LIVING ENVIRONMENT: Lives with: lives with their family and lives with their spouse Lives in: House/apartment Stairs: Yes: Internal: 16 steps; on left going up Has following equipment at home: Single point cane, Walker - 2 wheeled, shower chair, and Grab bars  OCCUPATION: teacher  PLOF: Requires assistive device for independence  PATIENT GOALS: walk without cane  NEXT MD VISIT: 1 yr  OBJECTIVE:   DIAGNOSTIC FINDINGS: N/a  PATIENT SURVEYS:  FOTO primary measure 63% with goal of 70%  COGNITION: Overall cognitive status: Within functional limits for tasks assessed     SENSATION: numbness through  right lateral hip area since surgey. Pt reports area has decreased in size     POSTURE: weight shift right and right foot pronation, forward balanced weight bearing on forefoot left knee valgus deformity  PALPATION: No discomfort  LOWER EXTREMITY ROM:  wfl  LOWER EXTREMITY MMT:  MMT Right eval Left eval Right / Left 03/10/23  Hip flexion 42.5 18.5 49.6 / 36.3  Hip extension 4/5 4+/5   Hip abduction 27.5 30.5 34.1 / 38.7  Hip adduction     Hip internal rotation     Hip external rotation     Knee flexion 46.0 41.4 52.7 / 52.6  Knee extension     Ankle  dorsiflexion     Ankle plantarflexion     Ankle inversion     Ankle eversion      (Blank rows = not tested)   FUNCTIONAL TESTS:  5 times sit to stand: 20.65 Timed up and go (TUG): 15.46  Berg 36/56   03/10/23  5x STS: 16.70  TUG: 10.19 with cane  Berg:49/56  GAIT: Distance walked: 500 Assistive device utilized: Single point cane Level of assistance: Complete Independence Comments:  flexed trunk over Rt hip, forward leaning, little heel strike   TODAY'S TREATMENT:                                                                                                                              Pt seen for aquatic therapy today.  Treatment took place in water 3.5-4.75 ft in depth at the [facility] pool. Temp of water was 91.  Pt entered/exited the pool via stairs using step to pattern with bilat hand rail.   Exercises completed: - Hand Buoy Carry  - Standing Hip Abduction  - 1 x daily - 1-2 sets - 10 reps - Standing 3-Way Leg Reach  - 1 x daily - 1-2 sets - 10 reps - Standing Hip Flexion Extension at [employer]  - 1-2 sets - 10 reps - Noodle Step Down  - 2 sets - 10 reps - Water Step Up on Bottom Step  - 1-2 sets - 10 reps - Squat  - 1 x daily - 1-3 sets - 10 reps - Seated Straddle on Flotation Forward Breast Stroke Arms and Bicycle Legs  - Sit to Stand  - 1-2 sets - 10 reps - Flutter Kicking/Windshield Wipers  - 1-2 sets - 10 reps  Pt requires the buoyancy and hydrostatic pressure of water for support, and to offload joints by unweighting joint load by at least 50 % in navel deep water and by at least 75-80% in chest to neck deep water.  Viscosity of the water is needed for resistance of strengthening. Water current perturbations provides challenge to standing balance requiring increased core activation.    PATIENT EDUCATION:  Education details: aquatic progressions and modifications  Person educated: Patient Education method: Explanation Education comprehension:  verbalized understanding  HOME EXERCISE PROGRAM: Pt has  land based HEP from last/recent episode  Access Code: [email or web address] URL: [email or web address] Date: 03/01/2023 Prepared by: Geni Bers  This aquatic home exercise program from [employer] utilizes pictures from land based exercises, but has been adapted prior to lamination and issuance.   Exercises - Hand Buoy Carry   - Standing Hip Abduction  2 sets - 10 reps - Seated Straddle on Flotation Forward Breast Stroke Arms and Bicycle Legs   - Standing 3-Way Leg Reach 2 x 10 R/L - Noodle Step Down  - hip neutral then externally rotated x 10 ea 2 sets fast then slow - Standing Hip Flexion Extension at [employer]  2 x 10 - Water Step Up on Bottom Step  -Leading R/L x 10  - Squat    ASSESSMENT:  CLINICAL IMPRESSION: Pt issues heel lift.  Gait improved significantly decreasing forward flex over right hip and lateral displacement.  Objective testing complete and she has met most goals. She did not meet her Foto goals which is questionable as pt has greatly improve in amb walking with cane and has greatly improved her strength with decreased fall risk. She has had no falls and has a membership here at [facility] [employer] to continue with HEP.  Final aquatic laminated HEP issued and verbally instructed.  She demonstrates and reports indep.  She has reach her max potential and is ready for DC.        From eval:  Patient is a 57 y.o. f who was seen today for physical therapy evaluation and treatment for R hip weakness/aftercare THR. She has just recently completed an episode of OPPT land based 1/24. Presents today for aquatic based skilled PT intervention for instruction on exercise program as she has now a membership here at [employer]. 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 She continues to use a cane at all times. She reports compliance with land based HEP. Has gotten access to pool and is wanting instruction on an  aquatic HEP she can complete indep.  As per objective testing pt is a high risk for falls. She appears to have a slight leg length discrepancy right shorter than left.  Dr Despina Hick informed her it would resolve on its own but she may benefit from a lift in her shoe to improve stance and balance as she tends to lean forward onto forefoot in standing.  She is a good candidate for aquatic intervention and will benefit from the properties of water to progress towards functional goals.  OBJECTIVE IMPAIRMENTS: Abnormal gait, decreased balance, decreased strength, impaired sensation, postural dysfunction, and obesity.   ACTIVITY LIMITATIONS: carrying, lifting, standing, stairs, transfers, and reach over head  PARTICIPATION LIMITATIONS: shopping, community activity, and yard work  PERSONAL FACTORS: Fitness and Time since onset of injury/illness/exacerbation are also affecting patient's functional outcome.   REHAB POTENTIAL: Good  CLINICAL DECISION MAKING: Evolving/moderate complexity  EVALUATION COMPLEXITY: Moderate   GOALS: Goals reviewed with patient? Yes  SHORT TERM GOALS: Target date: 03/02/23 Pt will tolerate full aquatic sessions consistently without increase in pain and with improving function to demonstrate good toleration and effectiveness of intervention.   Baseline: Goal status: Met 03/01/23  2.  Pt will be assigned an initial aquatic HEP and will report of compliance 1 x week outside of PT visits. Baseline: not assigned Goal status: Met 03/01/23  3.  Pt will trial lift in shoe for improved leveling of LE Baseline:  Goal status: Met 03/10/23   LONG TERM GOALS: Target  date: 03/21/23  Pt to meet stated Foto Goal of 70% Baseline: 63% Goal status: Not met  2.  Pt will be indep with final aquatic HEP for continued management of condition  Baseline:  Goal status: Met 03/10/23  3.  Pt will improve on Berg balance test to >/= 50/56 to demonstrate a decrease in fall risk. Baseline:  36/56 Goal status: Not Met missed by 1 point. 03/10/23  4.  Pt will report no falls. Baseline:  Goal status: Met 03/10/23    PLAN:  PT FREQUENCY: 1x week  PT DURATION: 10 weeks: total of 6 visits. (Scheduled out due to scheduling conflicts). 1 land based to assess for lift in shoe  PLANNED INTERVENTIONS: Therapeutic exercises, Therapeutic activity, Neuromuscular re-education, Balance training, Gait training, Patient/Family education, Self Care, Joint mobilization, Stair training, Orthotic/Fit training, DME instructions, Aquatic Therapy, Dry Needling, Electrical stimulation, Cryotherapy, Moist heat, Taping, Ultrasound, Ionotophoresis 4mg /ml Dexamethasone, Manual therapy, and Re-evaluation  PLAN FOR NEXT SESSION:  heel lift, objective testing; dc

## 2023-03-22 ENCOUNTER — Ambulatory Visit (HOSPITAL_BASED_OUTPATIENT_CLINIC_OR_DEPARTMENT_OTHER): Payer: BC Managed Care – PPO | Admitting: Physical Therapy

## 2023-03-29 ENCOUNTER — Ambulatory Visit (HOSPITAL_BASED_OUTPATIENT_CLINIC_OR_DEPARTMENT_OTHER): Payer: BC Managed Care – PPO | Admitting: Physical Therapy

## 2023-04-05 ENCOUNTER — Ambulatory Visit (HOSPITAL_BASED_OUTPATIENT_CLINIC_OR_DEPARTMENT_OTHER): Payer: BC Managed Care – PPO | Admitting: Physical Therapy
